# Patient Record
Sex: Male | Born: 1940 | Race: White | Hispanic: No | Marital: Married
Health system: Midwestern US, Academic
[De-identification: ages and names within clinical notes are randomized; demographics above are authoritative.]

---

## 2016-11-21 MED ORDER — APIXABAN 5 MG PO TAB
5 mg | ORAL_TABLET | Freq: Two times a day (BID) | ORAL | 11 refills | Status: DC
Start: 2016-11-21 — End: 2017-07-26

## 2016-12-12 MED ORDER — SODIUM CHLORIDE 0.9 % IV SOLP
INTRAVENOUS | 0 refills | Status: CN
Start: 2016-12-12 — End: ?

## 2016-12-17 MED ORDER — MAGNESIUM HYDROXIDE 2,400 MG/10 ML PO SUSP
10 mL | Freq: Once | ORAL | 0 refills | Status: CP
Start: 2016-12-17 — End: ?

## 2016-12-17 MED ORDER — APIXABAN 5 MG PO TAB
5 mg | Freq: Two times a day (BID) | ORAL | 0 refills | Status: DC
Start: 2016-12-17 — End: 2016-12-19

## 2016-12-17 MED ORDER — SODIUM CHLORIDE 0.9 % IV SOLP
INTRAVENOUS | 0 refills | Status: DC
Start: 2016-12-17 — End: 2016-12-18

## 2016-12-17 MED ORDER — POTASSIUM CHLORIDE 20 MEQ PO TBTQ
20-40 meq | ORAL | 0 refills | Status: AC | PRN
Start: 2016-12-17 — End: ?

## 2016-12-17 MED ORDER — DOFETILIDE 500 MCG PO CAP
500 ug | ORAL | 0 refills | Status: DC
Start: 2016-12-17 — End: 2016-12-19

## 2016-12-17 MED ORDER — ONDANSETRON HCL 4 MG PO TAB
4 mg | ORAL | 0 refills | Status: DC | PRN
Start: 2016-12-17 — End: 2016-12-19

## 2016-12-17 MED ORDER — ACETAMINOPHEN 325 MG PO TAB
650 mg | ORAL | 0 refills | Status: DC | PRN
Start: 2016-12-17 — End: 2016-12-19

## 2016-12-17 MED ORDER — MAGNESIUM SULFATE IN D5W 1 GRAM/100 ML IV PGBK
1 g | INTRAVENOUS | 0 refills | Status: AC | PRN
Start: 2016-12-17 — End: ?

## 2016-12-17 MED ORDER — POTASSIUM CHLORIDE 20 MEQ/15 ML PO LIQD
20-40 meq | ORAL | 0 refills | Status: AC | PRN
Start: 2016-12-17 — End: ?

## 2016-12-17 MED ORDER — LOSARTAN 50 MG PO TAB
50 mg | Freq: Every day | ORAL | 0 refills | Status: DC
Start: 2016-12-17 — End: 2016-12-19

## 2016-12-17 MED ORDER — DOCUSATE SODIUM 100 MG PO CAP
100 mg | Freq: Every day | ORAL | 0 refills | Status: DC | PRN
Start: 2016-12-17 — End: 2016-12-19

## 2016-12-17 MED ORDER — METOPROLOL SUCCINATE 25 MG PO TB24
25 mg | Freq: Every day | ORAL | 0 refills | Status: DC
Start: 2016-12-17 — End: 2016-12-18

## 2016-12-17 MED ORDER — SENNOSIDES 8.6 MG PO TAB
1 | Freq: Two times a day (BID) | ORAL | 0 refills | Status: DC
Start: 2016-12-17 — End: 2016-12-19

## 2016-12-17 MED ORDER — TAMSULOSIN 0.4 MG PO CP24
.4 mg | Freq: Every day | ORAL | 0 refills | Status: DC
Start: 2016-12-17 — End: 2016-12-18

## 2016-12-17 MED ORDER — TAMSULOSIN 0.4 MG PO CP24
.4 mg | Freq: Every day | ORAL | 0 refills | Status: DC
Start: 2016-12-17 — End: 2016-12-19

## 2016-12-17 MED ORDER — FINASTERIDE 5 MG PO TAB
5 mg | Freq: Every day | ORAL | 0 refills | Status: DC
Start: 2016-12-17 — End: 2016-12-19

## 2016-12-17 MED ORDER — ASPIRIN 81 MG PO TBEC
81 mg | Freq: Every day | ORAL | 0 refills | Status: DC
Start: 2016-12-17 — End: 2016-12-19

## 2016-12-18 MED ORDER — MAGNESIUM HYDROXIDE 2,400 MG/10 ML PO SUSP
10 mL | Freq: Once | ORAL | 0 refills | Status: CP
Start: 2016-12-18 — End: ?

## 2016-12-18 MED ORDER — AMLODIPINE 2.5 MG PO TAB
2.5 mg | Freq: Every day | ORAL | 0 refills | Status: DC
Start: 2016-12-18 — End: 2016-12-19

## 2016-12-18 MED ORDER — HYDRALAZINE 20 MG/ML IJ SOLN
10 mg | INTRAVENOUS | 0 refills | Status: DC | PRN
Start: 2016-12-18 — End: 2016-12-19

## 2016-12-18 MED ORDER — METOPROLOL SUCCINATE 50 MG PO TB24
50 mg | Freq: Every day | ORAL | 0 refills | Status: DC
Start: 2016-12-18 — End: 2016-12-19

## 2016-12-19 MED ORDER — AMLODIPINE 2.5 MG PO TAB
2.5 mg | ORAL_TABLET | Freq: Every day | ORAL | 3 refills | Status: DC
Start: 2016-12-19 — End: 2017-02-23

## 2016-12-19 MED ORDER — DOFETILIDE 500 MCG PO CAP
500 ug | ORAL_CAPSULE | Freq: Two times a day (BID) | ORAL | 1 refills | Status: DC
Start: 2016-12-19 — End: 2016-12-19

## 2016-12-19 MED ORDER — DOFETILIDE 500 MCG PO CAP
500 ug | ORAL_CAPSULE | Freq: Two times a day (BID) | ORAL | 1 refills | Status: DC
Start: 2016-12-19 — End: 2017-02-23

## 2016-12-19 MED ORDER — AMLODIPINE 2.5 MG PO TAB
2.5 mg | Freq: Once | ORAL | 0 refills | Status: DC
Start: 2016-12-19 — End: 2016-12-19

## 2016-12-19 MED ORDER — DOFETILIDE 500 MCG PO CAP
500 ug | ORAL_CAPSULE | Freq: Two times a day (BID) | ORAL | 0 refills | Status: DC
Start: 2016-12-19 — End: 2017-02-04

## 2016-12-19 MED ORDER — METOPROLOL SUCCINATE 50 MG PO TB24
50 mg | Freq: Every day | ORAL | 0 refills | Status: DC
Start: 2016-12-19 — End: 2016-12-19

## 2017-01-08 ENCOUNTER — Encounter: Admit: 2017-01-08 | Discharge: 2017-01-08 | Payer: MEDICARE

## 2017-01-08 NOTE — Telephone Encounter
-----   Message from Vickie Epley, RN sent at 01/08/2017 10:32 AM CDT -----  Regarding: FW: SDO LINQ pt with AF events      ----- Message -----  From: Stephenie Acres A  Sent: 01/08/2017  10:26 AM  To: Vickie Epley, RN  Subject: [facility] LINQ pt with AF events                       Presenting EGM on 01/08/2017 @ 00:04:50 shows SB in the 40's.     Reviewed Full Report shows:    #15 AF 6/7 @ 09:37 lasting 20 hrs 26 min with a median V rate at 150 bpm shows AF.  #14 AF 6/4 @ 10:07 lasting 23 hrs 24 min with a median V rate at 122 bpm shows SR > AF   #13 AF 5/20 @ 19:55 lasting 15 hrs 38 min with a median V rate at 113 bpm shows AF with sudden bursts of SR beats > AF  #11 AF 5/8 @ 12:01 lasting 6 hrs 32 min with a median V rate at 154 bpm shows AF with some AFL    Please see scanned data sheets for further review.  Results routed to Dr. Ricard Dillon for signature and review.

## 2017-01-08 NOTE — Telephone Encounter
Patient was aware of arrhythmias.  He is currently doing much better.  Routed to [facility] for review and recommendations.

## 2017-01-09 ENCOUNTER — Ambulatory Visit: Admit: 2017-01-09 | Discharge: 2017-01-10 | Payer: MEDICARE

## 2017-01-09 DIAGNOSIS — I480 Paroxysmal atrial fibrillation: Principal | ICD-10-CM

## 2017-01-25 ENCOUNTER — Encounter: Admit: 2017-01-25 | Discharge: 2017-01-25 | Payer: MEDICARE

## 2017-01-25 NOTE — Telephone Encounter
Dr Larina Bras advised that Stanley Campbell come in for appt with Dr Larina Bras to discuss potential LAAA ablation.  attempted to call,  LM asking them to c/b monday.  Dr Larina Bras advised he come in for an appt on 7/9 to discuss.   He has an appt on 7/10 with Dr Ricard Dillon in [place]  after they call back will determine appt status

## 2017-01-25 NOTE — Telephone Encounter
I called # and message rec'd. "the wireless customer you are calling is not taking messages".    I called the home # and was able to speak with his wife. She states he doesn't usually feel AF. I asked if he's reported any episodes of fatigue or chest pain or shortness of breath. She said that she asked him earlier today how he was feeling and he said that he was "little more tired than usual".  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     Patient is on tikosyn 500 mcg BID, toprol XL 25 mg/d and eliquis.   He has not been on any other AAD.    I reviewed my note with VN, she is going check if MPE available as he is in lab and scheduled to leave mid-day.

## 2017-01-25 NOTE — Telephone Encounter
-----   Message from Wilder Glade O'Laughlin sent at 01/25/2017 11:26 AM CDT -----  Regarding: MPE linq  Presenting EGM: 01/21/17 @ 0004 SB 40's    Summary report received and reviewed 2 new AF event  # 17- AF- 01/19/17 @ 1503 lasting 20 hours 4 minutes with median V rate of 107 bpm- AFL  # 16- AF- 01/09/17 @ 1151 lasting 18 hours 26 minutes with median V rate of 113 bpm- AFL    Please see scanned data sheets for further review, will continue to monitor.  Results routed to Dr. Larina Bras for signature and review.

## 2017-01-28 NOTE — Telephone Encounter
Louis Meckel, LPN  [facility] Nurse Ep            VM from wife [name] on triage line at 8:57am.   Michela Pitcher that they need to go volunteer and to call back on cell # [phone].      Spoke with [name] who tells me that patient is agreeable to OV with MPE on 02/04/17. Will have Jocelyn Lamer, RN CB wife to for time to w/in on 7/9.

## 2017-01-29 NOTE — Telephone Encounter
left message that there is an opening at 10:30 on 7/9 at the [place] office to discuss LAAA ablation.   asked them to c/b if further questions

## 2017-01-31 ENCOUNTER — Encounter: Admit: 2017-01-31 | Discharge: 2017-01-31 | Payer: MEDICARE

## 2017-01-31 DIAGNOSIS — Z959 Presence of cardiac and vascular implant and graft, unspecified: Principal | ICD-10-CM

## 2017-02-04 ENCOUNTER — Ambulatory Visit: Admit: 2017-02-04 | Discharge: 2017-02-05 | Payer: MEDICARE

## 2017-02-04 ENCOUNTER — Ambulatory Visit: Admit: 2017-02-04 | Discharge: 2017-02-04 | Payer: MEDICARE

## 2017-02-04 ENCOUNTER — Encounter: Admit: 2017-02-04 | Discharge: 2017-02-04 | Payer: MEDICARE

## 2017-02-04 DIAGNOSIS — I483 Typical atrial flutter: Principal | ICD-10-CM

## 2017-02-04 DIAGNOSIS — I4891 Unspecified atrial fibrillation: Principal | ICD-10-CM

## 2017-02-04 DIAGNOSIS — I10 Essential (primary) hypertension: ICD-10-CM

## 2017-02-04 DIAGNOSIS — I480 Paroxysmal atrial fibrillation: ICD-10-CM

## 2017-02-04 DIAGNOSIS — N400 Enlarged prostate without lower urinary tract symptoms: ICD-10-CM

## 2017-02-04 LAB — CBC AND DIFF
Lab: 15 g/dL (ref 14.00–18.00)
Lab: 44 % (ref 42.00–52.00)

## 2017-02-04 MED ORDER — LIDOCAINE (PF) 10 MG/ML (1 %) IJ SOLN
.1-2 mL | INTRAMUSCULAR | 0 refills | Status: CN | PRN
Start: 2017-02-04 — End: ?

## 2017-02-04 MED ORDER — SODIUM CHLORIDE 0.9 % IV SOLP
INTRAVENOUS | 0 refills | Status: CN
Start: 2017-02-04 — End: ?
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
## 2017-02-05 ENCOUNTER — Encounter: Admit: 2017-02-05 | Discharge: 2017-02-05 | Payer: MEDICARE

## 2017-02-05 ENCOUNTER — Ambulatory Visit: Admit: 2017-02-05 | Discharge: 2017-02-06 | Payer: MEDICARE

## 2017-02-05 DIAGNOSIS — I480 Paroxysmal atrial fibrillation: ICD-10-CM

## 2017-02-05 DIAGNOSIS — I483 Typical atrial flutter: Principal | ICD-10-CM

## 2017-02-05 DIAGNOSIS — I10 Essential (primary) hypertension: ICD-10-CM

## 2017-02-05 DIAGNOSIS — N400 Enlarged prostate without lower urinary tract symptoms: ICD-10-CM

## 2017-02-05 DIAGNOSIS — I4891 Unspecified atrial fibrillation: Principal | ICD-10-CM

## 2017-02-05 LAB — MAGNESIUM: Lab: 2.3 mg/dL — ABNORMAL HIGH (ref 1.5–2.5)

## 2017-02-05 LAB — BASIC METABOLIC PANEL: Lab: 99 mg/dL (ref 65–99)

## 2017-02-05 NOTE — Assessment & Plan Note
He has continued to have intermittent bouts of atrial arrhythmia despite the use of Tikosyn.  Today's QT interval is acceptable.  He is planning to proceed with an atrial flutter ablation later this month.

## 2017-02-05 NOTE — Assessment & Plan Note
He has labile BP--depends largely on his anxiety levels.

## 2017-02-05 NOTE — Assessment & Plan Note
He's planning to pursue flutter ablation later this month with Dr. Larina Bras.

## 2017-02-05 NOTE — Progress Notes
Date of Service: 02/05/2017    Stanley Campbell is a 76 y.o. male.       HPI     Stanley Campbell and his wife were in the [place] office today for a visit regarding his paroxysmal atrial arrhythmias.  He just saw Dr. Naoma Diener yesterday and there are plans for a flutter ablation later this month.    Stanley Campbell remains on oral anticoagulation and Tikosyn.  He has fairly profound weakness while in his atrial arrhythmias but he cannot specifically feel palpitation symptoms during the episodes.    He has had no bleeding complications related to oral anticoagulation nor has he had any TIA or stroke symptoms.    He denies any angina or exertional breathlessness.  Despite the difficulty with atrial arrhythmias he has been able to get a fair [place].         Vitals:    02/05/17 0954 02/05/17 1003   BP: 148/86 152/86   Pulse: 54    Weight: 90.3 kg (199 lb)    Height: 1.829 m (6')      Body mass index is 26.99 kg/m???.     Past Medical History  Patient Active Problem List    Diagnosis Date Noted   ??? Typical atrial flutter (HCC) 02/04/2017     Added automatically from request for surgery [id number]     ??? 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 Paroxysmal atrial fibrillation (HCC) 08/18/2015     ~2005 - Isolated episode of PAF  07/2015 - AF episode while in [facility] ED for hypertension  09/11/16 - Rapid AF during treadmill MPI at [facility] study     ??? Hypertension 08/18/2015   ??? BPH (benign prostatic hyperplasia) 08/18/2015   ??? Anxiety 08/18/2015         Review of Systems   Constitution: Negative.   HENT: Positive for hearing loss.    Eyes: Negative.    Cardiovascular: Positive for irregular heartbeat and palpitations.   Respiratory: Negative.    Endocrine: Negative.    Hematologic/Lymphatic: Bruises/bleeds easily.   Skin: Positive for skin cancer.   Musculoskeletal: Negative.    Gastrointestinal: Negative.    Genitourinary: Positive for decreased libido.   Neurological: Negative. Psychiatric/Behavioral: Negative.    Allergic/Immunologic: Negative.        Physical Exam    Physical Exam   General Appearance: no distress   Skin: warm, no ulcers or xanthomas   Digits and Nails: no cyanosis or clubbing   Eyes: conjunctivae and lids normal, pupils are equal and round   Teeth/Gums/Palate: dentition unremarkable, no lesions   Lips & Oral Mucosa: no pallor or cyanosis   Neck Veins: normal JVP , neck veins are not distended   Thyroid: no nodules, masses, tenderness or enlargement   Chest Inspection: chest is normal in appearance   Respiratory Effort: breathing comfortably, no respiratory distress   Auscultation/Percussion: lungs clear to auscultation, no rales or rhonchi, no wheezing   PMI: PMI not enlarged or displaced   Cardiac Rhythm: regular rhythm and normal rate   Cardiac Auscultation: S1, S2 normal, no rub, no gallop   Murmurs: no murmur   Peripheral Circulation: normal peripheral circulation   Carotid Arteries: normal carotid upstroke bilaterally, no bruits   Radial Arteries: normal symmetric radial pulses   Abdominal Aorta: no abdominal aortic bruit   Pedal Pulses: normal symmetric pedal pulses   Lower Extremity Edema: no lower extremity edema   Abdominal Exam:  soft, non-tender, no masses, bowel sounds normal   Liver & Spleen: no organomegaly   Gait & Station: walks without assistance   Muscle Strength: normal muscle tone   Orientation: oriented to time, place and person   Affect & Mood: appropriate and sustained affect   Language and Memory: patient responsive and seems to comprehend information   Neurologic Exam: neurological assessment grossly intact   Other: moves all extremities      Cardiovascular Studies    EKG:  Sinus rhythm, rate 54.  Left anterior fascicular block.  QTc 425 msec.    Problems Addressed Today  Encounter Diagnoses   Name Primary?   ??? Typical atrial flutter (HCC)    ??? Essential hypertension    ??? Paroxysmal atrial fibrillation (HCC)        Assessment and Plan Typical atrial flutter (HCC)  He's planning to pursue flutter ablation later this month with Dr. Naoma Diener.      Hypertension  He has labile BP--depends largely on his anxiety levels.    Paroxysmal atrial fibrillation (HCC)  He has continued to have intermittent bouts of atrial arrhythmia despite the use of Tikosyn.  Today's QT interval is acceptable.  He is planning to proceed with an atrial flutter ablation later this month.      Current Medications (including today's revisions)  ??? ALPRAZolam (XANAX) 0.25 mg tablet Take 0.25 mg by mouth at bedtime as needed for Anxiety.   ??? amLODIPine (NORVASC) 2.5 mg tablet Take 1 tablet by mouth daily.   ??? apixaban (ELIQUIS) 5 mg tablet Take 1 tablet by mouth twice daily.   ??? aspirin EC 81 mg tablet Take 1 Tab by mouth daily. Take with food.   ??? dofetilide (TIKOSYN) 500 mcg capsule Take 1 capsule by mouth twice daily.   ??? finasteride (PROSCAR) 5 mg tablet Take 5 mg by mouth daily.   ??? losartan(+) (COZAAR) 100 mg tablet Take 50 mg by mouth daily.   ??? metoprolol XL (TOPROL XL) 25 mg extended release tablet Take 25 mg by mouth daily.   ??? midodrine (PROAMATINE) 5 mg tablet Take 1 Tab by mouth every 4 hours as needed (low blood pressure).   ??? tamsulosin (FLOMAX) 0.4 mg capsule Take 0.4 mg by mouth daily. Do not crush, chew or open capsules. Take 30 minutes following the same meal each day.

## 2017-02-08 ENCOUNTER — Ambulatory Visit: Admit: 2017-02-08 | Discharge: 2017-02-09 | Payer: MEDICARE

## 2017-02-08 DIAGNOSIS — I480 Paroxysmal atrial fibrillation: Principal | ICD-10-CM

## 2017-02-13 ENCOUNTER — Encounter: Admit: 2017-02-13 | Discharge: 2017-02-13 | Payer: MEDICARE

## 2017-02-13 MED ORDER — DOCUSATE SODIUM 100 MG PO CAP
100 mg | Freq: Every day | ORAL | 0 refills | Status: CN | PRN
Start: 2017-02-13 — End: ?

## 2017-02-13 MED ORDER — NITROGLYCERIN 0.4 MG SL SUBL
.4 mg | SUBLINGUAL | 0 refills | Status: CN | PRN
Start: 2017-02-13 — End: ?

## 2017-02-13 MED ORDER — ACETAMINOPHEN 325 MG PO TAB
650 mg | ORAL | 0 refills | Status: CN | PRN
Start: 2017-02-13 — End: ?

## 2017-02-13 MED ORDER — ALUMINUM-MAGNESIUM HYDROXIDE 200-200 MG/5 ML PO SUSP
30 mL | ORAL | 0 refills | Status: CN | PRN
Start: 2017-02-13 — End: ?

## 2017-02-13 NOTE — H&P (View-Only)
The original H and P below was performed on 02/05/17 by Dr. Barry Dienes.    Ceola Para A. [email or web address] PA-C (pgr [phone])     Vanice Sarah, MD   Cardiology   Typical atrial flutter [facility]) +2 more   Dx   Paroxysmal Afib, Hypertension; Referred by Steva Ready, MD   Reason for Visit    Progress Notes   Vanice Sarah, MD (Physician) ??? ??? Cardiology ??? ??? 02/05/17 1000 ??? ??? Signed      Date of Service: 02/05/2017  ???  ZAEL SHUMAN is a 76 y.o. male.     ???  HPI  Stanley Campbell and his wife were in the [place] office today for a visit regarding his paroxysmal atrial arrhythmias.  He just saw Dr. Naoma Diener yesterday and there are plans for a flutter ablation later this month.  ???  Stanley Campbell remains on oral anticoagulation and Tikosyn.  He has fairly profound weakness while in his atrial arrhythmias but he cannot specifically feel palpitation symptoms during the episodes.  ???  He has had no bleeding complications related to oral anticoagulation nor has he had any TIA or stroke symptoms.  ???  He denies any angina or exertional breathlessness.  Despite the difficulty with atrial arrhythmias he has been able to [place] a [place].  ???  ???       Vitals:   ??? 02/05/17 0954 02/05/17 1003   BP: 148/86 152/86   Pulse: 54 ???   Weight: 90.3 kg (199 lb) ???   Height: 1.829 m (6') ???   ???  Body mass index is 26.99 kg/m???.   ???  Past Medical History        Patient Active Problem List   ??? Diagnosis Date Noted   ??? Typical atrial flutter (HCC) 02/04/2017   ??? ??? Added automatically from request for surgery [id number]  ???   ??? 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 Paroxysmal atrial fibrillation (HCC) 08/18/2015   ??? ??? ~2005 - Isolated episode of PAF  07/2015 - AF episode while in [facility] ED for hypertension  09/11/16 - Rapid AF during treadmill MPI at [facility] study  ???   ??? Hypertension 08/18/2015   ??? BPH (benign prostatic hyperplasia) 08/18/2015   ??? Anxiety 08/18/2015   ???  ???  ???  Review of Systems   Constitution: Negative. HENT: Positive for hearing loss.    Eyes: Negative.    Cardiovascular: Positive for irregular heartbeat and palpitations.   Respiratory: Negative.    Endocrine: Negative.    Hematologic/Lymphatic: Bruises/bleeds easily.   Skin: Positive for skin cancer.   Musculoskeletal: Negative.    Gastrointestinal: Negative.    Genitourinary: Positive for decreased libido.   Neurological: Negative.    Psychiatric/Behavioral: Negative.    Allergic/Immunologic: Negative.    ???  ???  Physical Exam  ???  Physical Exam   General Appearance: no distress   Skin: warm, no ulcers or xanthomas   Digits and Nails: no cyanosis or clubbing   Eyes: conjunctivae and lids normal, pupils are equal and round   Teeth/Gums/Palate: dentition unremarkable, no lesions   Lips & Oral Mucosa: no pallor or cyanosis   Neck Veins: normal JVP , neck veins are not distended   Thyroid: no nodules, masses, tenderness or enlargement   Chest Inspection: chest is normal in appearance   Respiratory Effort: breathing comfortably, no respiratory distress   Auscultation/Percussion: lungs clear to auscultation, no  rales or rhonchi, no wheezing   PMI: PMI not enlarged or displaced   Cardiac Rhythm: regular rhythm and normal rate   Cardiac Auscultation: S1, S2 normal, no rub, no gallop   Murmurs: no murmur   Peripheral Circulation: normal peripheral circulation   Carotid Arteries: normal carotid upstroke bilaterally, no bruits   Radial Arteries: normal symmetric radial pulses   Abdominal Aorta: no abdominal aortic bruit   Pedal Pulses: normal symmetric pedal pulses   Lower Extremity Edema: no lower extremity edema   Abdominal Exam: soft, non-tender, no masses, bowel sounds normal   Liver & Spleen: no organomegaly   Gait & Station: walks without assistance   Muscle Strength: normal muscle tone   Orientation: oriented to time, place and person   Affect & Mood: appropriate and sustained affect   Language and Memory: patient responsive and seems to comprehend information Neurologic Exam: neurological assessment grossly intact   Other: moves all extremities  ???  ???  Social History   Substance Use Topics   ??? Smoking status: Never Smoker   ??? Smokeless tobacco: Never Used   ??? Alcohol use No       No past surgical history on file.    No Known Allergies    Family History   Problem Relation Age of Onset   ??? Hypertension Mother    ??? Depression Mother    ??? Hypertension Father    ??? Cancer Father          Cardiovascular Studies  ???  EKG:  Sinus rhythm, rate 54.  Left anterior fascicular block.  QTc 425 msec.  ???  Problems Addressed Today       Encounter Diagnoses   Name Primary?   ??? Typical atrial flutter (HCC) ???   ??? Essential hypertension ???   ??? Paroxysmal atrial fibrillation (HCC) ???   ???  ???  Assessment and Plan  ???  Typical atrial flutter (HCC)  He's planning to pursue flutter ablation later this month with Dr. Naoma Diener.    ???  Hypertension  He has labile BP--depends largely on his anxiety levels.  ???  Paroxysmal atrial fibrillation (HCC)  He has continued to have intermittent bouts of atrial arrhythmia despite the use of Tikosyn.  Today's QT interval is acceptable.  He is planning to proceed with an atrial flutter ablation later this month.  ???  ???  Current Medications (including today's revisions)  ??? ALPRAZolam (XANAX) 0.25 mg tablet Take 0.25 mg by mouth at bedtime as needed for Anxiety.   ??? amLODIPine (NORVASC) 2.5 mg tablet Take 1 tablet by mouth daily.   ??? apixaban (ELIQUIS) 5 mg tablet Take 1 tablet by mouth twice daily.   ??? aspirin EC 81 mg tablet Take 1 Tab by mouth daily. Take with food.   ??? dofetilide (TIKOSYN) 500 mcg capsule Take 1 capsule by mouth twice daily.   ??? finasteride (PROSCAR) 5 mg tablet Take 5 mg by mouth daily.   ??? losartan(+) (COZAAR) 100 mg tablet Take 50 mg by mouth daily.   ??? metoprolol XL (TOPROL XL) 25 mg extended release tablet Take 25 mg by mouth daily.   ??? midodrine (PROAMATINE) 5 mg tablet Take 1 Tab by mouth every 4 hours as needed (low blood pressure). ??? tamsulosin (FLOMAX) 0.4 mg capsule Take 0.4 mg by mouth daily. Do not crush, chew or open capsules. Take 30 minutes following the same meal each day.   ???  ???

## 2017-02-16 ENCOUNTER — Encounter: Admit: 2017-02-16 | Discharge: 2017-02-16 | Payer: MEDICARE

## 2017-02-18 ENCOUNTER — Encounter: Admit: 2017-02-18 | Discharge: 2017-02-18 | Payer: MEDICARE

## 2017-02-18 DIAGNOSIS — N400 Enlarged prostate without lower urinary tract symptoms: ICD-10-CM

## 2017-02-18 DIAGNOSIS — I481 Persistent atrial fibrillation: Secondary | ICD-10-CM

## 2017-02-18 DIAGNOSIS — I483 Typical atrial flutter: ICD-10-CM

## 2017-02-18 DIAGNOSIS — I10 Essential (primary) hypertension: ICD-10-CM

## 2017-02-18 DIAGNOSIS — I4891 Unspecified atrial fibrillation: Principal | ICD-10-CM

## 2017-02-18 LAB — BASIC METABOLIC PANEL
Lab: 1.2 mg/dL — ABNORMAL HIGH (ref <5.7)
Lab: 10 mg/dL — ABNORMAL HIGH (ref >=60)
Lab: 106 MMOL/L (ref 98–110)
Lab: 139 MMOL/L (ref 137–147)
Lab: 23 mg/dL (ref <150)
Lab: 25 MMOL/L (ref 21–30)
Lab: 4.2 MMOL/L (ref 3.5–5.1)
Lab: 56 mL/min — ABNORMAL LOW (ref >60)
Lab: 8 mg/dL (ref <200)
Lab: 99 mg/dL — ABNORMAL LOW (ref >40)
Lab: >60 mL/min (ref >60)

## 2017-02-18 LAB — MAGNESIUM: Lab: 2.4 mg/dL (ref 1.6–2.6)

## 2017-02-18 MED ORDER — PROPOFOL INJ 10 MG/ML IV VIAL
0 refills | Status: DC
Start: 2017-02-18 — End: 2017-02-18

## 2017-02-18 MED ORDER — SODIUM CHLORIDE 0.9 % IV SOLP
INTRAVENOUS | 0 refills | Status: AC
Start: 2017-02-18 — End: ?

## 2017-02-18 MED ORDER — APIXABAN 5 MG PO TAB
5 mg | Freq: Two times a day (BID) | ORAL | 0 refills | Status: DC
Start: 2017-02-18 — End: 2017-02-23
  Administered 2017-02-19 – 2017-02-23 (×10): 5 mg via ORAL

## 2017-02-18 MED ORDER — MAGNESIUM HYDROXIDE 2,400 MG/10 ML PO SUSP
10 mL | Freq: Every evening | ORAL | 0 refills | Status: DC | PRN
Start: 2017-02-18 — End: 2017-02-23
  Administered 2017-02-19 – 2017-02-21 (×3): 10 mL via ORAL

## 2017-02-18 MED ORDER — MIDODRINE 5 MG PO TAB
5 mg | ORAL | 0 refills | Status: DC | PRN
Start: 2017-02-18 — End: 2017-02-21

## 2017-02-18 MED ORDER — FINASTERIDE 5 MG PO TAB
5 mg | Freq: Every day | ORAL | 0 refills | Status: DC
Start: 2017-02-18 — End: 2017-02-23
  Administered 2017-02-19 – 2017-02-23 (×5): 5 mg via ORAL

## 2017-02-18 MED ORDER — AMLODIPINE 5 MG PO TAB
2.5 mg | Freq: Every day | ORAL | 0 refills | Status: DC
Start: 2017-02-18 — End: 2017-02-21
  Administered 2017-02-19 – 2017-02-20 (×2): 2.5 mg via ORAL

## 2017-02-18 MED ORDER — TAMSULOSIN 0.4 MG PO CAP
.4 mg | Freq: Every day | ORAL | 0 refills | Status: DC
Start: 2017-02-18 — End: 2017-02-23
  Administered 2017-02-19 – 2017-02-23 (×6): 0.4 mg via ORAL

## 2017-02-18 MED ORDER — METOPROLOL SUCCINATE 25 MG PO TB24
25 mg | Freq: Every day | ORAL | 0 refills | Status: DC
Start: 2017-02-18 — End: 2017-02-23
  Administered 2017-02-19 – 2017-02-22 (×4): 25 mg via ORAL

## 2017-02-18 MED ORDER — CEFAZOLIN INJ 1GM IVP
1 g | INTRAVENOUS | 0 refills | Status: CP
Start: 2017-02-18 — End: ?
  Administered 2017-02-19: 1 g via INTRAVENOUS

## 2017-02-18 MED ORDER — LIDOCAINE HCL 2 % MM JELP
Freq: Once | TOPICAL | 0 refills | Status: AC
Start: 2017-02-18 — End: ?

## 2017-02-18 MED ORDER — ACETAMINOPHEN 325 MG PO TAB
325-650 mg | ORAL | 0 refills | Status: DC | PRN
Start: 2017-02-18 — End: 2017-02-23
  Administered 2017-02-20: 10:00:00 650 mg via ORAL
  Administered 2017-02-20: 19:00:00 325 mg via ORAL
  Administered 2017-02-21: 02:00:00 650 mg via ORAL

## 2017-02-18 MED ORDER — LIDOCAINE (PF) 10 MG/ML (1 %) IJ SOLN
.1-2 mL | INTRAMUSCULAR | 0 refills | Status: DC | PRN
Start: 2017-02-18 — End: 2017-02-19

## 2017-02-18 MED ORDER — NITROGLYCERIN 0.4 MG SL SUBL
.4 mg | SUBLINGUAL | 0 refills | Status: DC | PRN
Start: 2017-02-18 — End: 2017-02-23

## 2017-02-18 MED ORDER — ALUMINUM-MAGNESIUM HYDROXIDE 200-200 MG/5 ML PO SUSP
30 mL | ORAL | 0 refills | Status: DC | PRN
Start: 2017-02-18 — End: 2017-02-23

## 2017-02-18 MED ORDER — ACETAMINOPHEN 325 MG PO TAB
650 mg | ORAL | 0 refills | Status: DC | PRN
Start: 2017-02-18 — End: 2017-02-18

## 2017-02-18 MED ORDER — ALPRAZOLAM 0.25 MG PO TAB
.25 mg | Freq: Every evening | ORAL | 0 refills | Status: DC | PRN
Start: 2017-02-18 — End: 2017-02-18

## 2017-02-18 MED ORDER — DOFETILIDE 500 MCG PO CAP
500 ug | Freq: Two times a day (BID) | ORAL | 0 refills | Status: DC
Start: 2017-02-18 — End: 2017-02-19

## 2017-02-18 MED ORDER — MIDODRINE 5 MG PO TAB
5 mg | ORAL | 0 refills | Status: DC | PRN
Start: 2017-02-18 — End: 2017-02-18

## 2017-02-18 MED ORDER — SODIUM CHLORIDE 0.9 % IV SOLP
INTRAVENOUS | 0 refills | Status: DC
Start: 2017-02-18 — End: 2017-02-18
  Administered 2017-02-18: 15:00:00 1000.000 mL via INTRAVENOUS

## 2017-02-18 MED ORDER — LIDOCAINE (PF) 10 MG/ML (1 %) IJ SOLN
.1-2 mL | INTRAMUSCULAR | 0 refills | Status: DC | PRN
Start: 2017-02-18 — End: 2017-02-18

## 2017-02-18 MED ORDER — CEFAZOLIN INJ 1GM IVP
2 g | Freq: Once | INTRAVENOUS | 0 refills | Status: CP
Start: 2017-02-18 — End: ?
  Administered 2017-02-19: 16:00:00 2 g via INTRAVENOUS

## 2017-02-18 MED ORDER — CEFAZOLIN INJ 1GM IVP
1 g | INTRAVENOUS | 0 refills | Status: DC
Start: 2017-02-18 — End: 2017-02-19

## 2017-02-18 MED ORDER — LIDOCAINE HCL 2 % MM JELP
Freq: Once | TOPICAL | 0 refills | Status: DC
Start: 2017-02-18 — End: 2017-02-19

## 2017-02-18 MED ORDER — CEFAZOLIN INJ 1GM IVP
2 g | Freq: Once | INTRAVENOUS | 0 refills | Status: DC
Start: 2017-02-18 — End: 2017-02-19

## 2017-02-18 MED ORDER — ASPIRIN 81 MG PO TBEC
81 mg | Freq: Every day | ORAL | 0 refills | Status: DC
Start: 2017-02-18 — End: 2017-02-23
  Administered 2017-02-19 – 2017-02-23 (×5): 81 mg via ORAL

## 2017-02-18 MED ORDER — DOCUSATE SODIUM 100 MG PO CAP
100 mg | Freq: Every day | ORAL | 0 refills | Status: DC | PRN
Start: 2017-02-18 — End: 2017-02-23
  Administered 2017-02-19 – 2017-02-23 (×5): 100 mg via ORAL

## 2017-02-18 MED ORDER — LIDOCAINE (PF) 10 MG/ML (1 %) IJ SOLN
.1-2 mL | INTRAMUSCULAR | 0 refills | Status: DC | PRN
Start: 2017-02-18 — End: 2017-02-21

## 2017-02-18 MED ORDER — LOSARTAN 50 MG PO TAB
50 mg | Freq: Every day | ORAL | 0 refills | Status: DC
Start: 2017-02-18 — End: 2017-02-21
  Administered 2017-02-19 – 2017-02-20 (×2): 50 mg via ORAL

## 2017-02-18 MED ORDER — ALPRAZOLAM 0.25 MG PO TAB
.25 mg | ORAL | 0 refills | Status: DC | PRN
Start: 2017-02-18 — End: 2017-02-23
  Administered 2017-02-18 – 2017-02-21 (×3): 0.25 mg via ORAL

## 2017-02-18 MED ORDER — HYDROCODONE-ACETAMINOPHEN 5-325 MG PO TAB
1 | ORAL | 0 refills | Status: DC | PRN
Start: 2017-02-18 — End: 2017-02-23

## 2017-02-18 MED ORDER — ONDANSETRON HCL (PF) 4 MG/2 ML IJ SOLN
4 mg | INTRAVENOUS | 0 refills | Status: DC | PRN
Start: 2017-02-18 — End: 2017-02-23

## 2017-02-18 NOTE — Progress Notes
Patient arrived on unit via ambulation accompanied by family. Patient transferred to the bed without assistance. Frailty score equals 2  Assessment completed, refer to flowsheet for details. Orders released, reviewed, and implemented as appropriate. Oriented to surroundings, call light within reach. Plan of care reviewed.  Will continue to monitor and assess.

## 2017-02-18 NOTE — Anesthesia Post-Procedure Evaluation
Post-Anesthesia Evaluation    Name: Stanley Campbell      MRN: [id number]     DOB: 01/28/1941     Age: 76 y.o.     Sex: male   __________________________________________________________________________     Procedure Date: 02/18/2017  Procedure: Procedure(s):  INTRACARDIAC CATHETER ABLATION WITH COMPREHENSIVE ELECTROPHYSIOLOGIC EVALUATION - TYPICAL FLUTTER      Surgeon: Surgeon(s):  Emert, Vilma Prader, MD    Post-Anesthesia Vitals         Post Anesthesia Evaluation Note    Evaluation location: other (EP lab)  Patient participation: recovered; patient unable to participate at baseline (Recovered to baseline of EP sedation)  Level of consciousness: sleepy but conscious  Pain management: adequate    Hydration: normovolemia  Temperature: 36.0C - 38.4C  Airway patency: adequate    Perioperative Events  Perioperative events:  no       Post-op nausea and vomiting: no PONV    Postoperative Status  Cardiovascular status: hemodynamically stable  Respiratory status: spontaneous ventilation and supplemental oxygen      Perioperative Events  Perioperative Event: No  Emergency Case Activation: No

## 2017-02-18 NOTE — Progress Notes
Cardiac Electrophysiology Brief Post-Procedure Note    Attending Operator: Holland Commons, MD    Procedure: Attempted but aborted right-sided CTI AFL RFA  Elective external DC synchronous cardioversion    Complications:  None    Preop Diagnosis: Paroxysmal Atrial Fibrillation and atrial flutter  Post op Diagnosis:  Same    Findings: Catheters positioned and has started RF ablation patient went into atrial flutter.  The flutter was organized on the right side intermittently but with changing activation and clearly AFIB with fractionation on the left side and started with a PAC from the LA based on the CS activation being earliest compared to the crista or lateral RA wall.  Also the activation pattern suggested potential passive activation of the RA.  We attempted cardioversion on 3 separate occasions at 300 J followed by 360 J followed by another 360 J.  We did not restore any sinus rhythm.  We then moved the patches to an anterior posterior location.  We again attempted to 360 J cardioversions.  The second 1 with manual pressure on the anterior patch resulted in conversion to sinus bradycardia for 3-4 beats followed by early recurrent AFIB.  His ventricular rates are in the 110-120 bpm range.    At that point unable to restore sinus rhythm and unable to assess for any ablation after doing some RF lines with questionable contact along the isthmus and a markedly rotated RA we ended the procedure.    Specimens removed:  None  Estimated blood loss: 10 cc    Sheaths: In half [place] SRO long sheath placed via the right femoral vein used for mapping and ablation; 7 [place] sheath via the right femoral vein for the Cristae 20 pulled deflectable catheter along the lateral wall of the RA and 6 French sheath in the right femoral vein for the CS decapolar deflectable catheter.    Recommendations:   Please refer to post-procedure note for full recommendations  Pain control as needed Plan for DDDR permanent pacemaker implantation with cardioversion at that time tomorrow.  Initiation of Sotalol once pacemaker implanted  Discontinuation of Tikosyn

## 2017-02-19 ENCOUNTER — Encounter: Admit: 2017-02-19 | Discharge: 2017-02-19 | Payer: MEDICARE

## 2017-02-19 DIAGNOSIS — I481 Persistent atrial fibrillation: Secondary | ICD-10-CM

## 2017-02-19 LAB — BASIC METABOLIC PANEL: Lab: 138 MMOL/L — ABNORMAL LOW (ref >60)

## 2017-02-19 LAB — CBC: Lab: 7.1 K/UL (ref 4.5–11.0)

## 2017-02-19 LAB — MAGNESIUM: Lab: 2.2 mg/dL — ABNORMAL LOW (ref 1.6–2.6)

## 2017-02-19 MED ORDER — SOTALOL 120 MG PO TAB
120 mg | ORAL | 0 refills | Status: DC
Start: 2017-02-19 — End: 2017-02-22
  Administered 2017-02-20 – 2017-02-22 (×7): 120 mg via ORAL

## 2017-02-19 MED ORDER — POTASSIUM CHLORIDE 20 MEQ PO TBTQ
20-40 meq | ORAL | 0 refills | Status: AC | PRN
Start: 2017-02-19 — End: ?

## 2017-02-19 MED ORDER — MAGNESIUM SULFATE IN D5W 1 GRAM/100 ML IV PGBK
1 g | INTRAVENOUS | 0 refills | Status: AC | PRN
Start: 2017-02-19 — End: ?

## 2017-02-19 MED ORDER — PROPOFOL INJ 10 MG/ML IV VIAL
0 refills | Status: DC
Start: 2017-02-19 — End: 2017-02-19

## 2017-02-19 MED ORDER — PERFLUTREN LIPID MICROSPHERES 1.1 MG/ML IV SUSP
1-20 mL | Freq: Once | INTRAVENOUS | 0 refills | Status: CP
Start: 2017-02-19 — End: ?
  Administered 2017-02-19: 19:00:00 2 mL via INTRAVENOUS

## 2017-02-19 MED ORDER — POTASSIUM CHLORIDE 20 MEQ/15 ML PO LIQD
20-40 meq | ORAL | 0 refills | Status: AC | PRN
Start: 2017-02-19 — End: ?

## 2017-02-19 NOTE — Anesthesia Pre-Procedure Evaluation
Anesthesia Pre-Procedure Evaluation    Name: Stanley Campbell      MRN: [id number]     DOB: 1940/10/17     Age: 76 y.o.     Sex: male   __________________________________________________________________________     Procedure Date: 02/19/2017   Procedure: Procedure(s):  INSERTION/ REPLACEMENT PERMANENT PACEMAKER WITH ATRIAL AND VENTRICULAR LEAD  External Cardioversion  Removal Cardiac Event Recorder     Physical Assessment  Vital Signs (last filed in past 24 hours):  BP: 138/87 (07/24 0849)  Temp: 36 ???C (96.8 ???F) (07/24 0849)  Pulse: 124 (07/24 0849)  Respirations: 16 PER MINUTE (07/24 0400)  SpO2: 99 % (07/24 0610)  O2 Delivery: None (Room Air) (07/24 0610)  Height: 182.9 cm (72) (07/23 0959)  Weight: 90.5 kg (199 lb 8.3 oz) (07/23 0959)      Patient History  No Known Allergies     Current Medications    Medication Directions   ALPRAZolam (XANAX) 0.25 mg tablet Take 0.25 mg by mouth at bedtime as needed for Anxiety.   amLODIPine (NORVASC) 2.5 mg tablet Take 1 tablet by mouth daily.   apixaban (ELIQUIS) 5 mg tablet Take 1 tablet by mouth twice daily.   aspirin EC 81 mg tablet Take 1 Tab by mouth daily. Take with food.   dofetilide (TIKOSYN) 500 mcg capsule Take 1 capsule by mouth twice daily.   finasteride (PROSCAR) 5 mg tablet Take 5 mg by mouth daily.   losartan(+) (COZAAR) 100 mg tablet Take 50 mg by mouth daily.   metoprolol XL (TOPROL XL) 25 mg extended release tablet Take 25 mg by mouth daily.   midodrine (PROAMATINE) 5 mg tablet Take 1 Tab by mouth every 4 hours as needed (low blood pressure).   tamsulosin (FLOMAX) 0.4 mg capsule Take 0.4 mg by mouth daily. Do not crush, chew or open capsules. Take 30 minutes following the same meal each day.         Review of Systems/Medical History      Patient summary reviewed  Nursing notes reviewed  Pertinent labs reviewed    PONV Screening: Non-smoker  No history of anesthetic complications  No family history of anesthetic complications      Airway - negative Pulmonary - negative          Cardiovascular         Exercise tolerance: >4 METS       Beta Blocker therapy: Yes      Beta blockers within 24 hours: Yes        Hypertension, well controlled      Dysrhythmias (A flutter.  History of A fib resolved with meds per patient)      GI/Hepatic/Renal - negative        Neuro/Psych         Sensory deficit (Patient wears hearing aids)        Psychiatric history          Anxiety (and claustrophobia - patient asks not to be restrained until after sedation)      Musculoskeletal - negative        Endocrine/Other         Malignancy (History of multiple skin cancers removed)      Enlarged prostate   Physical Exam    Airway Findings      Mallampati: II      TM distance: >3 FB      Neck ROM: full      Mouth opening: good  Airway patency: adequate    Cardiovascular Findings:       Rhythm: regular      Rate: normal      Comments: Atrial Flutter      Pulmonary Findings: Negative      Breath sounds clear to auscultation.    Abdominal Findings: Negative      Neurological Findings: Negative         Diagnostic Tests  Hematology:   Lab Results   Component Value Date    HGB 14.5 02/19/2017    HCT 43.7 02/19/2017    PLTCT 195 02/19/2017    WBC 7.1 02/19/2017    NEUT 75.8 02/04/2017    ANC 5.1 02/04/2017    ALC 1.0 02/04/2017    MCV 91.2 02/19/2017    MCH 30.3 02/19/2017    MCHC 33.3 02/19/2017    MPV 8.3 02/19/2017    RDW 12.9 02/19/2017         General Chemistry:   Lab Results   Component Value Date    NA 138 02/19/2017    K 4.2 02/19/2017    CL 106 02/19/2017    CO2 27 02/19/2017    GAP 5 02/19/2017    BUN 24 02/19/2017    CR 1.22 02/19/2017    GLU 107 02/19/2017    GLU 99 02/04/2017    CA 9.6 02/19/2017    ALBUMIN 4.0 02/08/2016    MG 2.2 02/19/2017    TOTBILI 1.10 02/08/2016      Coagulation:   Lab Results   Component Value Date    INR 1.2 12/18/2016         Anesthesia Plan    ASA score: 3   Plan: MAC  Induction method: intravenous  NPO status: acceptable Comments: (Multiple DCCV attempted yesterday without success. Tolerated well. )      Informed Consent  Anesthetic plan and risks discussed with patient.  Use of blood products discussed with patient; consented to blood products.      Plan discussed with: CRNA and anesthesiologist.

## 2017-02-19 NOTE — Progress Notes
Pt appears to be out of AFib. EKG obtained. Pt Atrial Paced. Merlyn Albert, APRN at bedside.

## 2017-02-19 NOTE — Consults
Admission Date: 02/18/2017  Date of Consultation:  02/19/2017  LOS: 0 days  Requesting Physician: Delbert Harness, MD  Consulting Physician:  Leanne Lovely, MD   Code Status: Prior    Reason for Consultation  Opinion and recommendations regarding EP physician, sotalol start.    Assessment/Plan:  Paroxysmal atrial fibrillation/atrial flutter  -ECG noted in Afib with PVCs HR 90 this am  -02/18/17 Attempted but aborted right-sided CTI AFL RFA, Afib noted, attempted multiple elective external DC synchronous cardioversion's, he converted to sinus bradycardia for 3-4 beats followed by early recurrent Afib.  His ventricular rates were in the 110-120 bpm range. At that point unable to restore sinus rhythm and unable to assess for any ablation after doing some RF lines with questionable contact along the isthmus and a markedly rotated RA we ended the procedure.  -Tikosyn discontinued, patient reported his last dose of Tikosyn was on 02/18/17 @ 0700.    Tachy brady syndrome  -Noted in the EP lab on 02/18/17, as noted above.  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     Hypertension  -stable, SBP 90s-180s, SBP since MN 90s-130s    Anxiety  -PTA Xanax PRN for anxiety    Plan:  -Patient stable s/p PPM implant today  -Admitted to Medicine service for Sotalol initiation.  -We are consulted for Sotalol and QTc management.   -Continue Eliquis for anticoagulation.   -Continue Toprol XL.  -Tikosyn washout is 36 hours. Patient reports he took last dose of Tikosyn on 02/18/17 @ 0700.  -OK to start Sotalol 120 mg PO every 10 hours @ 1900 this evening.   -Baseline QTc 407 msec, K+ 4.2, Mg 2.2, Cr 1.22, Crcl 68.136  -Will monitor QTc post 2 hours every Sotalol dose x 5 total doses.  -Daily BMP, Mg  -recommend keeping K+ 4.O or >, Mg 2.0 or >  -patient currently in sinus rhythm, atrial paced complexes.  -NPO after MN  -If patient converts back to Afib, will schedule DCCV tomorrow am. -CXR in am to assess for appropriate lead placement and no evidence of pneumothorax.   -Device check in am to assess PPM function, pacing, sensing, and threshold testing.  Addendum:  -Please call cardiology fellow after hours if QTc is not within protocol for next Sotalol dose.     Patient seen and plan discussed with Dr. Clint Bolder.     Lysle Morales, APRN-C    History of Present Illness  Stanley Campbell is a 76 y.o. male patient with history of paroxysmal atrial fib, typical atrial flutter, HTN, anxiety, and BPH. He was seen in the clinic for paroxysmal atrial arrhythmias and he decided on having an atrial flutter ablation. He presented yesterday for the atrial flutter ablation. He was taken to the EP lab, Dr. Naoma Diener Attempted but aborted right-sided CTI AFL RFA, he performed multiple elective external DC synchronous cardioversion's. Patient converted to sinus bradycardia for 3-4 beats followed by early recurrent Afib.  His ventricular rates were in the 110-120 bpm range. At that point unable to restore sinus rhythm and unable to assess for any ablation after doing some RF lines with questionable contact along the isthmus and a markedly rotated RA he ended the procedure. Dr. Naoma Diener recommended DDDR PPM implantation with cardioversion today by Dr. Anibal Henderson, then initiate Sotalol today after Tikosyn washout. Patient reports incisional pain at left chest PPM site. He denies chest pain, palpitations, SOA, nausea, abdominal or back pain.      Past Medical History  Past Medical History:   Diagnosis Date   ??? A-fib (HCC) 08/18/2015   ??? BPH (benign prostatic hyperplasia) 08/18/2015   ??? Hypertension 08/18/2015   ??? Typical atrial flutter [facility])          Social History  Social History     Social History   ??? Marital status: Married     Spouse name: N/A   ??? Number of children: N/A   ??? Years of education: N/A     Occupational History   ??? Not on file.     Social History Main Topics   ??? Smoking status: Never Smoker ??? Smokeless tobacco: Never Used   ??? Alcohol use No   ??? Drug use: No   ??? Sexual activity: Not on file     Other Topics Concern   ??? Not on file     Social History Narrative   ??? No narrative on file         Family History  Family History   Problem Relation Age of Onset   ??? Hypertension Mother    ??? Depression Mother    ??? Hypertension Father    ??? Cancer Father        Medications    amLODIPine (NORVASC) tablet 2.5 mg 2.5 mg Oral QDAY   apixaban (ELIQUIS) tablet 5 mg 5 mg Oral BID   aspirin EC tablet 81 mg 81 mg Oral QDAY   ceFAZolin (ANCEF) IVP 1 g 1 g Intravenous Q8H*   finasteride (PROSCAR) tablet 5 mg 5 mg Oral QDAY   lidocaine (XYLOCAINE) 2 % jelly  Topical ONCE   losartan (COZAAR) tablet 50 mg 50 mg Oral QDAY   metoprolol XL (TOPROL XL) tablet 25 mg 25 mg Oral QDAY   sotalol (BETAPACE) tablet 120 mg 120 mg Oral Q10H*   tamsulosin (FLOMAX) capsule 0.4 mg 0.4 mg Oral QDAY     acetaminophen Q4H PRN, ALPRAZolam Q6H PRN, aluminum/magnesium hydroxide Q4H PRN, docusate QDAY PRN, HYDROcodone/acetaminophen Q4H PRN, lidocaine PF PRN, midodrine Q4H PRN, milk of magnesia (CONC) QHS PRN, nitroglycerin Q5 MIN PRN, ondansetron (ZOFRAN) IV Q6H PRN    Allergies  No Known Allergies    Review of Systems  A 10 point review of systems was ascertained and is otherwise negative and/or normal except for:  Pain at [facility] left chest incision site as per HPI.                           Vital Signs: Most Recent                 Vital Signs: 24 Hour Range   BP: 115/71 (07/24 1330)  Temp: 36.4 ???C (97.6 ???F) (07/24 1145)  Pulse: 71 (07/24 1330)  Respirations: 23 PER MINUTE (07/24 1330)  SpO2: 97 % (07/24 1330)  O2 Delivery: None (Room Air) (07/24 1145)  SpO2 Pulse: 69 (07/24 1330) BP: (92-174)/(56-136)   Temp:  [36 ???C (96.8 ???F)-37.8 ???C (100 ???F)]   Pulse:  [65-124]   Respirations:  [9 PER MINUTE-23 PER MINUTE]   SpO2:  [95 %-100 %]   O2 Delivery: None (Room Air)     Vitals:    02/18/17 0959   Weight: 90.5 kg (199 lb 8.3 oz) Intake/Output Summary (Last 24 hours) at 02/19/17 1358  Last data filed at 02/19/17 0000   Gross per 24 hour   Intake              825  ml   Output              650 ml   Net              175 ml           Physical Exam   General Appearance: resting comfortably, no acute distress  Skin: warm, moist, no ulcers or xanthomas  Digits and Nails: no clubbing  Eyes: conjunctivae and lids normal, pupils are equal and round  Lips & Oral Mucosa: no pallor or cyanosis  Ear, Nose, Throat: No deformities, posterior oral pharynx clear  Neck Veins: veins are not distended  Chest Inspection:  Left PPM site with Silverlon dressing D&I, No active bleeding or drainage noted. Edges of incision well approximated. No erythema noted. Minimal pocket edema with edges of device easily palpated. Mid chest primapore dressing D&I over explanted ILR site.   Respiratory Effort: breathing is unlabored, no respiratory distress  Auscultation/Percussion: lungs clear to auscultation, no rales, rhonchi, or wheezing  Cardiac Rhythm: regular rhythm and normal rate  Cardiac Auscultation: Normal S1 & S2, no S3 or S4, no rub  Murmurs: no cardiac murmurs   Carotid Arteries: normal carotid upstroke bilaterally, no bruits  Pulses: normal symmetric pedal pulses   Lower Extremity Edema:  right groin site OTA without significant hematoma or bruit noted. Pedal pulses present and normal bilaterally. no lower extremity edema  Abdominal Exam: soft, non-tender, no masses, bowel sounds normal  Gait & Station: normal balance and gait  Muscle Strength: normal strength and tone  Neurologic Exam: neurological assessment grossly intact  Orientation: oriented to time, place and person  Affect & Mood: appropriate and sustained affect  Other: moves all extremities      Labs  Hematology:    Lab Results   Component Value Date    HGB 14.5 02/19/2017    HCT 43.7 02/19/2017    PLTCT 195 02/19/2017    WBC 7.1 02/19/2017    NEUT 75.8 02/04/2017    ANC 5.1 02/04/2017 ALC 1.0 02/04/2017    MCV 91.2 02/19/2017    MCHC 33.3 02/19/2017    MPV 8.3 02/19/2017    RDW 12.9 02/19/2017   , Coagulation:    Lab Results   Component Value Date    INR 1.2 12/18/2016   , General Chemistry:    Lab Results   Component Value Date    NA 138 02/19/2017    K 4.2 02/19/2017    CL 106 02/19/2017    GAP 5 02/19/2017    BUN 24 02/19/2017    CR 1.22 02/19/2017    GLU 107 02/19/2017    GLU 99 02/04/2017    CA 9.6 02/19/2017    ALBUMIN 4.0 02/08/2016    MG 2.2 02/19/2017    TOTBILI 1.10 02/08/2016    and Lipid Profile:   Lab Results   Component Value Date    CHOL 177 02/08/2016    TRIG 46 02/08/2016    HDL 65 02/08/2016    LDL 99 02/08/2016    VLDL 9 02/08/2016       ECG Atrial paced complexes HR 62    Telemetry Afib earlier today, spontaneously converted to sinus rhythm today @ 1241.    02/18/2017 EP study and Typical atrial flutter ablation  Procedure: Attempted but aborted right-sided CTI AFL RFA, Elective external DC synchronous cardioversion  ???  Complications: ???None  ???  Findings: Catheters positioned and has started RF ablation patient went into atrial flutter. ???  The flutter was organized on the right side intermittently but with changing activation and clearly AFIB with fractionation on the left side and started with a PAC from the LA based on the CS activation being earliest compared to the crista or lateral RA wall. ???Also the activation pattern suggested potential passive activation of the RA.  We attempted cardioversion on 3 separate occasions at 300 J followed by 360 J followed by another 360 J. ???We did not restore any sinus rhythm. ???We then moved the patches to an anterior posterior location. ???We again attempted to 360 J cardioversions. ???The second 1 with manual pressure on the anterior patch resulted in conversion to sinus bradycardia for 3-4 beats followed by early recurrent AFIB. ???His ventricular rates are in the 110-120 bpm range.  ??? At that point unable to restore sinus rhythm and unable to assess for any ablation after doing some RF lines with questionable contact along the isthmus and a markedly rotated RA we ended the procedure.  ???  Sheaths: In half [place] SRO long sheath placed via the right femoral vein used for mapping and ablation; 7 French sheath via the right femoral vein for the Cristae???20 pulled deflectable catheter along the lateral wall of the RA and 6 French sheath in the right femoral vein for the CS decapolar deflectable catheter.  ???  Recommendations:???  Please refer to post-procedure note for full recommendations  Pain control as needed  ???  Plan for DDDR permanent pacemaker implantation with cardioversion at that time tomorrow.  Initiation of Sotalol once pacemaker implanted  Discontinuation of Tikosyn    02/19/17 PPM implant, ILR explant, Technically successful cardioversion, but with early return of AFib.   GENERATOR ??? ???    Manufacturer: Medtronic  ??? ???   Device Type:  DDD-PM ??? ???   Model Number: Azure XT DR MRI [id number] ??? ???   Serial Number: [id number] ??? ???   Implant Date: 02/19/2017 ??? ???   Investigational:  No ??? ???   Wireless:  Yes ??? ???   MRI Conditional:  Yes ??? ???   Universal Transmitter:     ??? ???   Remote Connectivity: Cellular Adaptor ??? ???   Remote Monitor Serial Number:   ??? ???   Accessory Serial Number:   ??? ???   Location: Left  ??? ???   Explanted Generator Number:   ??? ???   Explanted Generator Model Number:   ??? ???   Explanted Generator Serial Number:    ??? ??? ???   ERI/EOL Indicator:   ??? ???   ??? ??? ??? ???   ??? ATRIAL LEAD RV LEAD LV LEAD   Manufacturer: Medtronic  Medtronic       Model Number: CapSureFix Novus MRI 1324-40 CapSureFix Novus MRI 1027-25     Serial Number: [id number]     Implant Date: 02/19/2017 02/19/2017     Investigational:  No No     MRI Conditional:  Yes Yes     Lead Fixation: active fixation active fixation     Location: right atrial appendage   RV low septum       Pin Connector: IS1  IS1 Polarity: Bipolar  Bipolar       Coils: ???   ???   Configuration:  ??? ???      Additional Hardware:     ??? ??? ??? ???   LEAD FUNCTION ATRIAL LEAD RV LEAD LV LEAD   Sense: 1.4 (mV) 10.4 (mV)   (mV)  Capture Threshold: <2..0 (V)/ 0.5 (ms) 0.9 (V)/ 0.5 (ms)   (V)/   (ms)   Current: 1.1 (mA) 0.8 (mA)   (mA)   Impedance: 620 (ohms) 965 (ohms)   (ohms)   Output:  3.5@0 .4 3.5@0 .4  @    No Diaph. Stim. @  10 V 10 V   V   ??? ??? ??? ???

## 2017-02-19 NOTE — Progress Notes
Immediate post procedure note:    Procedure:  Dual pacer+ leads, LINQ removal, DC CVRT  Operators:  Gerda Yin D. Artist Beach, M.D.    Complications:  None  Preop Diagnosis: Persistent AF                            SSS  Post op Diagnosis:  Same  Findings: Successful CVRT at 200J but ERAF after 4 A paced beats  Specimens removed:  None  Estimated blood loss:  Minimal    Will b/g sotalol-per Dr Larina Bras

## 2017-02-19 NOTE — Progress Notes
EP Note:   Pt stable s/p Attempted but aborted right-sided CTI AFL RFA, Elective external DC synchronous cardioversion by Dr. Larina Bras.  ECG Afib with PVCs HR 90  Telemetry Afib, PVCs rhythm   VS, labs, and right groin site are stable.   Subjective: "I feel OK." Pt denies chest pain, SOA, palpitations, nausea, abdominal or back pain.   Awake and alert. Denies complaints overnight.     Physical Exam   General Appearance:  no acute distress   Neck Veins: neck veins are not distended  Auscultation/Percussion: lungs clear to auscultation, no rales or rhonchi, no wheezing   Cardiac Auscultation: S1, S2 normal, no rub, no gallop, murmurs   Carotid Arteries: no bruits   Lower Extremity : right groin site OTA and intact without significant hematoma or bruit noted. Pedal pulses present and normal bilaterally. no lower extremity edema   Abdominal Exam: soft, non-tender, non-distended, normal bowel sounds.     Plan:  -Pt stable, and NPO.  -patient had tachy brady syndrome while in the EP lab   -Patient will go to the EP lab this am for PPM implant, DCCV, and ILR explant by Dr. Artist Beach.      Merlyn Albert, APRN-C

## 2017-02-19 NOTE — Progress Notes
4 cardioversions yesterday with early return of A. fib each time.    One cardioversion earlier today with early return of a cast.  Last dose of dofetilide 7/23 AM.  I went by his room at 1 PM and he was spontaneously back in  Sinus/a pace.  We took the opportunity to fully check his atrial lead.  P waves were 4.9 mV and the threshold was 0.5 at 0.4.  Excellent atrial lead function.    He will start sotalol 120 mg p.o. at 7 PM.  Initial dosing will be every 10 hours.  Hopefully he stays in sinus rhythm and will be able to be discharged on the afternoon of Thursday the 26th.

## 2017-02-19 NOTE — H&P (View-Only)
Admission History and Physical Examination      Name:  Stanley Campbell                                             MRN:  [id number]   Admission Date:  02/18/2017                     Assessment/Plan:    Principal Problem:    Typical atrial flutter (HCC)  Active Problems:    Paroxysmal atrial fibrillation (HCC)    Hypertension    BPH (benign prostatic hyperplasia)    Anxiety    S/P ablation of atrial flutter    76 yoM PMH paroxysmal afib, tachy-brady syndrome s/p PPM 7/24, CKD3, BPH, HTN admitted from [facility] for sotalol start.    Paroxysmal Afib s/p cardioversion x4 7/23 & x1 7/24, tachy-brady syndrome s/p PPM 7/24  - last dose tikosyn 7/23  Plan:  - Cardiology EP consulted, starting sotalol this evening  - keep K>4, Mg>2  - PTA toprol xl, eliquis  - post-procedural abx per Cardiology    HTN  - PTA norvasc, losartan    CKD3  - at baseline    BPH  - PTA finasteride, flomax    FEN: No IVF, BMP QD, Cardiac  Code: Full, discussed on admit  PPx: Eliquis  Dispo: Admit to medicine on tele.    Vernetta Honey, DO  MP [facility] 3 [phone]  __________________________________________________________________________________  Primary Care Physician: Rockwell [place]  Verified    Chief Complaint:  sotalol start  History of Present Illness: Stanley KENDZIERSKI is a 76 y.o. male presents of his admission for sotalol start.  He has paroxysmal A. fib and underwent for cardioversions 7???23 that were unsuccessful maintaining sinus rhythm.  He was noted to have tachybradycardia syndrome and underwent pacemaker placement this morning as well and an additional cardioversion.  He spontaneously cardioverted to sinus rhythm.  Currently he has no chest pain, shortness of breath, or palpitations.  Pain at the new PPM site is well controlled with lidocaine patch.    Past Medical History:   Diagnosis Date   ??? A-fib (HCC) 08/18/2015   ??? BPH (benign prostatic hyperplasia) 08/18/2015   ??? Hypertension 08/18/2015 ??? Typical atrial flutter (HCC)      History reviewed. No pertinent surgical history.  Family History   Problem Relation Age of Onset   ??? Hypertension Mother    ??? Depression Mother    ??? Hypertension Father    ??? Cancer Father      Social History     Social History   ??? Marital status: Married     Spouse name: N/A   ??? Number of children: N/A   ??? Years of education: N/A     Social History Main Topics   ??? Smoking status: Never Smoker   ??? Smokeless tobacco: Never Used   ??? Alcohol use No   ??? Drug use: No   ??? Sexual activity: Not on file     Other Topics Concern   ??? Not on file     Social History Narrative   ??? No narrative on file      Immunizations (includes history and patient reported):   There is no immunization history on file for this patient.        Allergies:  Patient has no known allergies.  Medications:  Prescriptions Prior to Admission   Medication Sig   ??? ALPRAZolam (XANAX) 0.25 mg tablet Take 0.25 mg by mouth at bedtime as needed for Anxiety.   ??? amLODIPine (NORVASC) 2.5 mg tablet Take 1 tablet by mouth daily.   ??? apixaban (ELIQUIS) 5 mg tablet Take 1 tablet by mouth twice daily.   ??? aspirin EC 81 mg tablet Take 1 Tab by mouth daily. Take with food.   ??? dofetilide (TIKOSYN) 500 mcg capsule Take 1 capsule by mouth twice daily.   ??? finasteride (PROSCAR) 5 mg tablet Take 5 mg by mouth daily.   ??? losartan(+) (COZAAR) 100 mg tablet Take 50 mg by mouth daily.   ??? metoprolol XL (TOPROL XL) 25 mg extended release tablet Take 25 mg by mouth daily.   ??? midodrine (PROAMATINE) 5 mg tablet Take 1 Tab by mouth every 4 hours as needed (low blood pressure).   ??? tamsulosin (FLOMAX) 0.4 mg capsule Take 0.4 mg by mouth daily. Do not crush, chew or open capsules. Take 30 minutes following the same meal each day.     Review of Systems:  A 14 point review of systems was negative except for: paroxysmal afib, pain at incision site    Physical Exam:  Vital Signs: Last Filed In 24 Hours Vital Signs: 24 Hour Range BP: 122/68 (07/24 1403)  Temp: 36.4 ???C (97.6 ???F) (07/24 1145)  Pulse: 62 (07/24 1400)  Respirations: 12 PER MINUTE (07/24 1400)  SpO2: 98 % (07/24 1400)  O2 Delivery: None (Room Air) (07/24 1145)  SpO2 Pulse: 62 (07/24 1400)  Height: 182.9 cm (72) (07/24 1403) BP: (92-174)/(56-136)   Temp:  [36 ???C (96.8 ???F)-37.8 ???C (100 ???F)]   Pulse:  [62-124]   Respirations:  [9 PER MINUTE-23 PER MINUTE]   SpO2:  [95 %-100 %]   O2 Delivery: None (Room Air)          Physical Exam  General: Alert, cooperative, no distress, appears stated age   HENT: NC, AT, EOMI, PERRL, nares patent without polyps or drainage, throat clear, no exudates or ulcers  Neck: Supple, nontender, no JVD or lymphadenopathy  Resp: Clear to auscultation bilaterally   CV: Regular rate and rhythm, S1, S2 normal, no murmur.  No peripheral edema.  Left chest wall with PPM covered with bandage C/D/I.  GI: Soft, non-tender. Bowel sounds normal.  Skin: Skin color normal. No rashes or lesions.  Normal turgor.   MSK: No atrophy or abnormal strength or tone in the head, neck, spine, ribs, pelvis or extremities.   Left arm in sling.  Neuro: No focal deficits, CN 2-12 grossly intact bilaterally, oriented x3  Psych: normal affect and mood, remote and recent memory intact      Lab/Radiology/Other Diagnostic Tests:  Pertinent labs reviewed  Glucose: (!) 107 (02/19/17 0430)  Pertinent radiology reviewed.  Echo reviewed.  Telemetry reviewed, NSR with intermittent PVCs.

## 2017-02-20 ENCOUNTER — Inpatient Hospital Stay: Admit: 2017-02-20 | Discharge: 2017-02-20 | Payer: MEDICARE

## 2017-02-20 ENCOUNTER — Encounter: Admit: 2017-02-20 | Discharge: 2017-02-20 | Payer: MEDICARE

## 2017-02-20 DIAGNOSIS — I10 Essential (primary) hypertension: ICD-10-CM

## 2017-02-20 DIAGNOSIS — I4891 Unspecified atrial fibrillation: Principal | ICD-10-CM

## 2017-02-20 DIAGNOSIS — I495 Sick sinus syndrome: ICD-10-CM

## 2017-02-20 DIAGNOSIS — Z950 Presence of cardiac pacemaker: ICD-10-CM

## 2017-02-20 DIAGNOSIS — I483 Typical atrial flutter: ICD-10-CM

## 2017-02-20 DIAGNOSIS — N400 Enlarged prostate without lower urinary tract symptoms: ICD-10-CM

## 2017-02-20 LAB — CBC
Lab: 13 % — ABNORMAL LOW (ref >60)
Lab: 15 g/dL (ref 13.5–16.5)
Lab: 207 10*3/uL (ref >60)
Lab: 30 pg (ref 26–34)
Lab: 33 g/dL (ref 32.0–36.0)
Lab: 45 % — ABNORMAL HIGH (ref 40–50)
Lab: 8.4 FL (ref 7–11)
Lab: 9.4 K/UL (ref 4.5–11.0)
Lab: 91 FL (ref 80–100)

## 2017-02-20 LAB — BASIC METABOLIC PANEL
Lab: 136 MMOL/L — ABNORMAL LOW (ref 137–147)
Lab: 4.5 MMOL/L (ref 3.5–5.1)

## 2017-02-20 LAB — PROTIME INR (PT): Lab: 1.2 MMOL/L — ABNORMAL LOW (ref 0.8–1.2)

## 2017-02-20 LAB — MAGNESIUM: Lab: 2.4 mg/dL (ref 1.6–2.6)

## 2017-02-20 NOTE — Med Student Progress Note
General Progress Note    Name:  Stanley Campbell   EAVWU'[id number] Date:  02/20/2017  Admission Date: 02/18/2017  LOS: 1 day                     Assessment/Plan:    Principal Problem:    Typical atrial flutter (HCC)  Active Problems:    Paroxysmal atrial fibrillation (HCC)    Hypertension    BPH (benign prostatic hyperplasia)    Anxiety    S/P ablation of atrial flutter    Paroxysmal atrial fibrillation  S/P ablation of atrial flutter  Tachy Brady Syndrome  -last dose of Tikosyn 02/18/2017   -s/p cardioversion x 4 on 7/23 and x 1 on 7/24  -K is 4.5 today  -Mg is 2.4 today  -tacy-brady syndrome initially noted in EP lab on 02/18/2017  -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  PLAN:  >cardiology-EP consulted, recs appreciated  >continue PTA metoprolol XL  >continue Eliquis 5 mg BID  >Sotalol 120 mg Q6H  >maintian K > 4 and Mg > 2  >continue Tylenol for post-procedure pain    Hypertension  -stable blood pressures in 110-120/70-80  >continue PTA amlodipine 2.5 mg daily  >continue PTA losartan mg daily    CKDIII  -Cr 1.2 today, at baseline    BPH  >continue PTA finasteride   >continue PTA flomax    Anxiety  >alprazolam 0.25 mg Q6H PRN    FEN  >no IVF  >BMP QD replace electrolytes PRN  >cardiac diet     PPX  >Eliquis    DISPO  >continue inpatient care to monitor sotalol start.  ________________________________________________________________________    Subjective  Stanley Campbell is a 76 y.o. male with PMH of atrial fibrillation, BPH, HTN, typical atrial flutter currently admitted for sotalol start. He denies chest pain, shortness of breath, or palpitations this morning. Reports some discomfort at procedure PPM site which worsens with deep breaths, but is alleviated with Tylenol and the lidocaine patch.    Medications  Scheduled Meds:  amLODIPine (NORVASC) tablet 2.5 mg 2.5 mg Oral QDAY   apixaban (ELIQUIS) tablet 5 mg 5 mg Oral BID   aspirin EC tablet 81 mg 81 mg Oral QDAY   finasteride (PROSCAR) tablet 5 mg 5 mg Oral QDAY   losartan (COZAAR) tablet 50 mg 50 mg Oral QDAY   metoprolol XL (TOPROL XL) tablet 25 mg 25 mg Oral QDAY   sotalol (BETAPACE) tablet 120 mg 120 mg Oral Q10H*   tamsulosin (FLOMAX) capsule 0.4 mg 0.4 mg Oral QDAY   Continuous Infusions:  ??? sodium chloride 0.9 %   infusion       PRN and Respiratory Meds:acetaminophen Q4H PRN, ALPRAZolam Q6H PRN, aluminum/magnesium hydroxide Q4H PRN, docusate QDAY PRN, HYDROcodone/acetaminophen Q4H PRN, lidocaine PF PRN, midodrine Q4H PRN, milk of magnesia (CONC) QHS PRN, nitroglycerin Q5 MIN PRN, ondansetron (ZOFRAN) IV Q6H PRN    Review of Systems:  Constitutional: negative  Eyes: negative  Respiratory: negative for cough, sputum, increased work of breathing, wheezing or dyspnea on exertion  Cardiovascular: negative for chest pain, dyspnea, palpitations, irregular heart beats, syncope, fatigue, claudication, does report some chest discomfort at [facility] site  Gastrointestinal: negative for nausea, vomiting,  change in bowel habits, melena, diarrhea, constipation and abdominal pain  Musculoskeletal:negative  Behavioral/Psych: positive for anxiety    Objective:                          Vital Signs: Last Filed                 Vital Signs: 24 Hour Range   BP: 137/88 (07/25 0730)  Temp: 36.3 ???C (97.4 ???F) (07/25 0730)  Pulse: 59 (07/25 0730)  Respirations: 18 PER MINUTE (07/25 0730)  SpO2: 99 % (07/25 0730)  O2 Delivery: None (Room Air) (07/25 0730)  SpO2 Pulse: 65 (07/24 1957)  Height: 182.9 cm (72) (07/24 1403) BP: (96-137)/(56-112)   Temp:  [36.3 ???C (97.4 ???F)-36.6 ???C (97.8 ???F)]   Pulse:  [59-110]   Respirations:  [8 PER MINUTE-23 PER MINUTE] SpO2:  [94 %-100 %]   O2 Delivery: None (Room Air)     Vitals:    02/18/17 0959   Weight: 90.5 kg (199 lb 8.3 oz)       Intake/Output Summary:  (Last 24 hours)    Intake/Output Summary (Last 24 hours) at 02/20/17 0908  Last data filed at 02/20/17 0400   Gross per 24 hour   Intake              840 ml   Output              350 ml   Net              490 ml         Physical Exam  Physical Exam   Constitutional: No distress.   HENT:   Head: Normocephalic and atraumatic.   Mouth/Throat: No oropharyngeal exudate.   Eyes: Conjunctivae and EOM are normal. Pupils are equal, round, and reactive to light. No scleral icterus.   Neck: Normal range of motion. Neck supple.   Cardiovascular: Normal rate and regular rhythm.  Exam reveals no gallop and no friction rub.    No murmur heard.  Pulmonary/Chest: Breath sounds normal. No respiratory distress. He exhibits tenderness.   Tenderness to palpation at [facility] site.   Abdominal: Soft. Bowel sounds are normal. He exhibits no distension. There is no tenderness.   Skin: He is not diaphoretic.     Lab Review  24-hour labs:    Results for orders placed or performed during the hospital encounter of 02/18/17 (from the past 24 hour(s))   PROTIME INR (PT)    Collection Time: 02/20/17  5:00 AM   Result Value Ref Range    INR 1.2 0.8 - 1.2   BASIC METABOLIC PANEL    Collection Time: 02/20/17  5:00 AM   Result Value Ref Range    Sodium 136 (L) 137 - 147 MMOL/L    Potassium 4.5 3.5 - 5.1 MMOL/L    Chloride 105 98 - 110 MMOL/L    CO2 27 21 - 30 MMOL/L    Anion Gap 4 3 - 12    Glucose 112 (H) 70 - 100 MG/DL    Blood Urea Nitrogen 25 7 - 25 MG/DL    Creatinine 1.61 0.4 - 1.24 MG/DL    Calcium 9.8 8.5 - 09.6 MG/DL    eGFR Non African American 59 (L) >60 mL/min    eGFR African American >60 >60 mL/min   CBC    Collection Time: 02/20/17  5:00 AM   Result Value Ref Range  White Blood Cells 9.4 4.5 - 11.0 K/UL    RBC 4.98 4.4 - 5.5 M/UL    Hemoglobin 15.3 13.5 - 16.5 GM/DL    Hematocrit 95.1 40 - 50 % MCV 91.7 80 - 100 FL    MCH 30.8 26 - 34 PG    MCHC 33.6 32.0 - 36.0 G/DL    RDW 88.4 11 - 15 %    Platelet Count 207 150 - 400 K/UL    MPV 8.4 7 - 11 FL   MAGNESIUM    Collection Time: 02/20/17  5:00 AM   Result Value Ref Range    Magnesium 2.4 1.6 - 2.6 mg/dL     Point of Care Testing  (Last 24 hours)  Glucose: (!) 112 (02/20/17 0500)    Radiology and other Diagnostics Review:    Pertinent radiology reviewed.    Vira Browns, [place]

## 2017-02-20 NOTE — Case Management (ED)
Case Management Admission Assessment    NAME:Stanley Campbell                          MRN: [id number]             DOB:[birth year]          AGE: 76 y.o.  ADMISSION DATE: 02/18/2017             DAYS ADMITTED: LOS: 1 day      Today???s Date: 02/20/2017    Source of Information: Patient, wife and EMR       Plan  Plan: CM Assessment, Assist PRN with SW/NCM Services, Discharge Planning for Home Anticipated     SW met with pt and wife for assessment.  Pt lives at home with his wife.  Pt independent with self care.  Wife will transport home.  No anticipated needs.      Patient Address/Phone  [address]  [name] [address]  [phone] (home)     Emergency Contact  Extended Emergency Contact Information  Primary Emergency Contact: [name], [name] [place]  Home Phone: [phone]  Relation: Spouse  Secondary Emergency Contact: [name] [place]  Home Phone: [phone]  Relation: Daughter    Editor, commissioning  Healthcare Directive: Yes, patient has a healthcare directive  Type of Healthcare Directive: Durable power of attorney for healthcare, Healthcare directive, Living Will  Location of Healthcare Directive: Patient does not have it with him/her  Would patient like to fill out a (a new) Editor, commissioning?: N/A  Psych Advance Directive (Psych unit only): No, patient does not have a Social research officer, government  Does the patient need discharge transport arranged?: No  Transportation Name, Phone and Availability #1: Wife, [name] [phone]  Does the patient use Medicaid Transportation?: No    Expected Discharge Date  Expected Discharge Date: 02/22/17    Living Situation Prior to Admission  ? Living Arrangements  Type of Residence: Home, independent  Living Arrangements: Spouse/significant other  How many levels in the residence?: 2  Can patient live on one level if needed?: Yes  Does residence have entry and/or side stairs?: No Assistance needed prior to admit or anticipated on discharge: No  Who provides assistance or could if needed?: Wife  Are they in good health?: Yes  ? Level of Function   Prior level of function: Independent  ? Cognitive Abilities   Cognitive Abilities: Alert and Oriented    Financial Resources  ? Coverage  Primary Insurance: Medicare  Secondary Insurance: No insurance  Additional Coverage: RX    ? Source of Income   Source Of Income: Other retirement income  ? Financial Assistance Needed?       Psychosocial Needs  ? Mental Health  Mental Health History: Yes (Pt states he has anxiety. His wife provides him homeopathic stuff for it. SW encouraged her to keep physician aprised of what she gives him. SW also updated primary team)  ? Substance Use History  Substance Use History Screen: No  ? Other       Current/Previous Services  ? PCP  [name], [place], [phone], [phone]  ? Pharmacy    [facility] [phone] - [place] [place] - [address]  [address]  [place] [address]  Phone: [phone] Fax: [phone]    [facility] - [place], [place] - [address]  [address]  [address]  [place] [address]  Phone: [phone] Fax: [phone]    ? Durable Medical Equipment   Durable Medical Equipment at home: None  ? Home Health  Receiving home health: No  ? Hemodialysis or Peritoneal Dialysis  Undergoing hemodialysis or peritoneal dialysis: No  ? Tube/Enteral Feeds  Receive tube/enteral feeds: No  ? Infusion  Receive infusions: No  ? Private Duty  Private duty help used: No  ? Home and Community Based Services  Home and community based services: No  ? [employer]: N/A  ? Hospice  Hospice: No  ? Outpatient Therapy  PT: No  OT: No  SLP: No  ? Skilled Nursing Facility/Nursing Home  SNF: No  NH: No  ? Inpatient Rehab  IPR: No  ? Long-Term Acute Care Hospital  LTACH: No  ? Acute Hospital Stay  Acute Hospital Stay: In the past Was patient's stay within the last 30 days?: No    Dellia Beckwith, LMSW *[phone]

## 2017-02-20 NOTE — Progress Notes
Patient arrived to room # (32*) via bed accompanied by RN. Patient transferred to the bed without assistance. Bedside safety checks completed. Initial patient assessment completed, refer to flowsheet for details. Admission skin assessment completed by:  , RN    Pressure Injury Present on Hospital Admission (within 24 hours): No    1. Occiput: No  2. Ear: No  3. Scapula: No  4. Spinous Process: No  5. Shoulder: No  6. Elbow: No  7. Iliac Crest: No  8. Sacrum/Coccyx: No  9. Ischial Tuberosity: No  10. Trochanter: No  11. Knee: No  12. Malleolus: No  13. Heel: No  14. Toes: No  15. Assessed for device associated injury No  16. Nursing Nutrition Assessment Completed No    See Doc Flowsheet for additional wound details.     INTERVENTIONS:

## 2017-02-20 NOTE — Progress Notes
Report called and given to [facility] RN.

## 2017-02-20 NOTE — Progress Notes
Pt transferred to [facility] [address] at 2040. Prior to leaving unit incisional sites with scant drainage and no hematoma noted. Pt walking in halls on unit in [place] frequently prior to transfer and pt requiring frequent reminders to not move left arm around despite being in sling. HS eliquis given prior to transfer as ordered. Upon arrival to [facility] 532 hematoma noted at [facility] site. Pressure applied per cardiotech. [facility] RN aware and RN offered other support.

## 2017-02-20 NOTE — Anesthesia Post-Procedure Evaluation
Post-Anesthesia Evaluation    Name: Stanley Campbell      MRN: [id number]     DOB: 02-07-1941     Age: 76 y.o.     Sex: male   __________________________________________________________________________     Procedure Date: 02/19/2017  Procedure: Procedure(s):  INSERTION/ REPLACEMENT PERMANENT PACEMAKER WITH ATRIAL AND VENTRICULAR LEAD  External Cardioversion  Removal Cardiac Event Recorder      Surgeon: Surgeon(s):  Jacolyn Reedy, MD    Post-Anesthesia Vitals         Post Anesthesia Evaluation Note    Evaluation location: other (EP LAB)  Patient participation: recovered; patient participated in evaluation  Level of consciousness: sleepy but conscious    Pain score: 0  Pain management: adequate    Hydration: normovolemia  Temperature: 36.0C - 38.4C  Airway patency: adequate    Perioperative Events  Perioperative events:  no       Post-op nausea and vomiting: no PONV    Postoperative Status  Cardiovascular status: hemodynamically stable  Respiratory status: spontaneous ventilation      Perioperative Events  Perioperative Event: No  Emergency Case Activation: No

## 2017-02-20 NOTE — Progress Notes
PHYSICAL THERAPY  NOTE         Patient denies changes from baseline mobility and reports no history of balance loss or falls in the last three months.  Patient endorses no concerns with mobility at home.  Currently, the patient is ambulating on the unit without difficulty.       Encouraged patient to continue mobilization while in the hospital.  Physical therapy services will be discontinued at this time, please re-consult if the patient has a change in mobility status.        Therapist: Mardene Sayer, PT, DPT  Date: 02/20/2017

## 2017-02-20 NOTE — Progress Notes
Cardiology Daily Progress Note    Stanley Campbell             Admission Date:  02/18/2017                     Assessment/Plan:    Principal Problem:    Typical atrial flutter [facility])  Active Problems:    Paroxysmal atrial fibrillation (HCC)    Hypertension    BPH (benign prostatic hyperplasia)    Anxiety    S/P ablation of atrial flutter    76 y.o. male patient with history of paroxysmal atrial fib, typical atrial flutter, HTN, anxiety, and BPH who was admitted for Sotalol initiation after aborted atrial flutter ablation due to recurrent atrial fibrillation.    Paroxysmal atrial fibrillation/atrial flutter  -On Toprol 25mg  daily, Eliquis 5mg  BID. CHADS-VASc score is 2 (age, HTN).  -02/18/17 Attempted right-sided CTI atrial flutter ablation but atrial fib noted- attempted multiple elective external DC synchronous cardioversions and he converted to sinus bradycardia for 3-4 beats followed by early recurrent Afib. His ventricular rates were in the 110-120 bpm range. Dr. Naoma Diener was unable to assess for any ablation after doing some RF lines with questionable contact along the isthmus and a markedly rotated RA so the procedure was aborted.  -02/19/17 am-ECG noted in Afib with PVCs VR 90. Patient had another cardioversion following pacemaker implant   -Tikosyn discontinued, patient reported his last dose of Tikosyn was on 02/18/17 @ 0700.  -Sotalol 120mg  q10 hours started at 1900 on 02/19/17  -EKG after first dose Sotalol revealed atrial pacing with one intrinsic sinus beat at a rate of 61, QTc (baseline 415 msec).  -EKG at 0700 this am after 2nd dose, revealed A paced rhythm, underlying SR, QTc  ???  Tachy brady syndrome  -Noted in the EP lab on 02/18/17  -s/p Medtronic dual chamber PPM implant, ILR explant 02/19/17  -CXR this am revealed no pneumothorax, adequate lead slack and appropriate placement. Reviewed by Dr. Wallene Huh  -Device check reveals normal function -Tele reveals primarily atrial pacing at 60, occasional intrinsic sinus beats  ???  Hypertension  -SBP 105-120s, diastolic BPs occasionally >100. Has PRN Midodrine ordered.  -On Toprol, Amlodipine 2.5mg  daily, Losartan 50mg  daily  ???  Anxiety  -PTA Xanax PRN for anxiety  ???  Plan:  -Continue Sotalol 120mg  q10 hours, next dose due at 1500  -EKG 2 hours after each dose of Sotalol, x 5 doses  -Continue Eliquis, Toprol XL.  -Daily BMP, Mg. Keep K >/= 4.0 and mag >/= 2.0      Patient seen and plan discussed with Dr. Wallene Huh    Please call cardiology fellow after hours if QTc is not within protocol for next Sotalol dose.   _____________________________________________________________________________    Subjective:  Pt complains of mild chest pain which is worse with inspiration.  Partially relieved with Tylenol.  None currently.  Denies palpitations, dizziness or lightheadedness.      Allergies:  Patient has no known allergies.    Medications:  Scheduled Meds:    amLODIPine (NORVASC) tablet 2.5 mg 2.5 mg Oral QDAY   apixaban (ELIQUIS) tablet 5 mg 5 mg Oral BID   aspirin EC tablet 81 mg 81 mg Oral QDAY   finasteride (PROSCAR) tablet 5 mg 5 mg Oral QDAY   losartan (COZAAR) tablet 50 mg 50 mg Oral QDAY   metoprolol XL (TOPROL XL) tablet 25 mg 25 mg Oral QDAY   sotalol (BETAPACE) tablet 120  mg 120 mg Oral Q10H*   tamsulosin (FLOMAX) capsule 0.4 mg 0.4 mg Oral QDAY   Continuous Infusions:  ??? sodium chloride 0.9 %   infusion       PRN and Respiratory Meds:acetaminophen Q4H PRN, ALPRAZolam Q6H PRN, aluminum/magnesium hydroxide Q4H PRN, docusate QDAY PRN, HYDROcodone/acetaminophen Q4H PRN, lidocaine PF PRN, midodrine Q4H PRN, milk of magnesia (CONC) QHS PRN, nitroglycerin Q5 MIN PRN, ondansetron (ZOFRAN) IV Q6H PRN       Physical Exam:  Vital Signs: Last Filed In 24 Hours Vital Signs: 24 Hour Range   BP: 105/70 (07/25 1138)  Temp: 36.4 ???C (97.5 ???F) (07/25 1138)  Pulse: 71 (07/25 1138)  Respirations: 20 PER MINUTE (07/25 1138) SpO2: 95 % (07/25 1138)  O2 Delivery: None (Room Air) (07/25 1138)  SpO2 Pulse: 65 (07/24 1957) BP: (105-137)/(64-112)   Temp:  [36.3 ???C (97.4 ???F)-36.6 ???C (97.8 ???F)]   Pulse:  [59-81]   Respirations:  [8 PER MINUTE-20 PER MINUTE]   SpO2:  [94 %-99 %]   O2 Delivery: None (Room Air)        Intake/Output Summary: (Last 24 hours)    Intake/Output Summary (Last 24 hours) at 02/20/17 1528  Last data filed at 02/20/17 0400   Gross per 24 hour   Intake              840 ml   Output              350 ml   Net              490 ml      Vitals:    02/18/17 0959   Weight: 90.5 kg (199 lb 8.3 oz)      Physical Exam   General Appearance: well developed, well nourished, appears stated age, no acute distress   Neck Veins: neck veins are not distended   Auscultation/Percussion: lungs clear to auscultation, no rales or rhonchi, no wheezing   Cardiac Auscultation: S1, S2 normal, no rub, no gallop, murmurs    Chest: left chest Silverlon dressing intact with small hematoma  Pedal Pulses: normal symmetric pedal pulses   Lower Extremity : no lower extremity edema   Abdominal Exam: soft, non-tender, non-distended, normal bowel sounds. No abdominal bruits   Neurologic Exam: neurological assessment grossly intact      Laboratory Review:   Hematology:    Lab Results   Component Value Date    HGB 15.3 02/20/2017    HCT 45.7 02/20/2017    PLTCT 207 02/20/2017    WBC 9.4 02/20/2017    NEUT 75.8 02/04/2017    ANC 5.1 02/04/2017    ALC 1.0 02/04/2017    MCV 91.7 02/20/2017    MCHC 33.6 02/20/2017    MPV 8.4 02/20/2017    RDW 13.1 02/20/2017   , General Chemistry:    Lab Results   Component Value Date    NA 136 02/20/2017    K 4.5 02/20/2017    CL 105 02/20/2017    GAP 4 02/20/2017    BUN 25 02/20/2017    CR 1.21 02/20/2017    GLU 112 02/20/2017    GLU 99 02/04/2017    CA 9.8 02/20/2017    ALBUMIN 4.0 02/08/2016    MG 2.4 02/20/2017    TOTBILI 1.10 02/08/2016    and Mg and PO4:   Lab Results   Component Value Date    MG 2.4 02/20/2017 Telemetry: NSR with primarily atrial pacing with rare PACs,  rare VPacing    Echocardiogram 02/19/2017:   1. Normal LV size, wall motion with EF =65% (accurately estimated with Definity Contrast)  2. Normal RV size and function. RA and RV pacemaker lead present.   3. Normal LA & RA size.   4. No significant valvular abnormality.   5. CVP 6-10 mmHg. & PA systolic pressure 29 mmHg.    6. No pericardial effusion.     Chest X-Ray 02/20/2017:    1. ???Left chest dual-lead cardiac conduction device placement as described.  2. ???Mild blunting of the right costophrenic sulcus, which may reflect a   small pleural effusion or scarring.  3. ???Nodular density projecting over the left lower lung, likely   representing a nipple shadow though follow-up chest radiograph with nipple   markers may be useful.    Korbyn Vanes, APRN-NP [phone])

## 2017-02-21 ENCOUNTER — Encounter: Admit: 2017-02-21 | Discharge: 2017-02-21 | Payer: MEDICARE

## 2017-02-21 DIAGNOSIS — I481 Persistent atrial fibrillation: Secondary | ICD-10-CM

## 2017-02-21 LAB — BASIC METABOLIC PANEL
Lab: 133 MMOL/L — ABNORMAL LOW (ref 137–147)
Lab: 1.3 mg/dL — ABNORMAL HIGH (ref 0.4–1.24)
Lab: 103 MMOL/L (ref 98–110)
Lab: 125 mg/dL — ABNORMAL HIGH (ref 70–100)
Lab: 135 MMOL/L — ABNORMAL LOW (ref 137–147)
Lab: 26 MMOL/L (ref 21–30)
Lab: 29 mg/dL — ABNORMAL HIGH (ref 7–25)
Lab: 4.4 MMOL/L (ref 3.5–5.1)
Lab: 52 mL/min — ABNORMAL LOW (ref >60)
Lab: 9.8 mg/dL (ref 8.5–10.6)
Lab: 6 (ref 3–12)

## 2017-02-21 LAB — MAGNESIUM: Lab: 2.6 mg/dL — ABNORMAL LOW (ref >60)

## 2017-02-21 LAB — TROPONIN-I
Lab: 0 ng/mL (ref 0.0–0.05)
Lab: 0 ng/mL (ref 0.0–0.05)
Lab: 0 ng/mL — ABNORMAL LOW (ref 0.0–0.05)

## 2017-02-21 LAB — CBC: Lab: 11 K/UL — ABNORMAL HIGH (ref 4.5–11.0)

## 2017-02-21 LAB — PROTIME INR (PT): Lab: 1.5 g/dL — ABNORMAL HIGH (ref >60)

## 2017-02-21 MED ORDER — LACTATED RINGERS IV SOLP
500 mL | INTRAVENOUS | 0 refills | Status: CP
Start: 2017-02-21 — End: ?
  Administered 2017-02-22: 02:00:00 500 mL via INTRAVENOUS

## 2017-02-21 MED ORDER — INDOMETHACIN 25 MG PO CAP
50 mg | Freq: Three times a day (TID) | ORAL | 0 refills | Status: DC
Start: 2017-02-21 — End: 2017-02-23
  Administered 2017-02-21 – 2017-02-23 (×5): 50 mg via ORAL

## 2017-02-21 MED ORDER — LACTATED RINGERS IV SOLP
500 mL | INTRAVENOUS | 0 refills | Status: CP
Start: 2017-02-21 — End: ?
  Administered 2017-02-21: 13:00:00 500 mL via INTRAVENOUS

## 2017-02-21 NOTE — Progress Notes
Spoke with CV fellow, Dr. Erling Cruz regarding pt's BP and next dose of Sotalol. Okay to give dose of Sotalol as ordered. Pt appearing anxious as well and physician okay with pt's PRN dose of Ativan.

## 2017-02-21 NOTE — Progress Notes
Cardiology Daily Progress Note    Stanley Campbell             Admission Date:  02/18/2017                     Assessment/Plan:    Principal Problem:    Typical atrial flutter (HCC)  Active Problems:    Paroxysmal atrial fibrillation (HCC)    Hypertension    BPH (benign prostatic hyperplasia)    Anxiety    S/P ablation of atrial flutter    Tachy-brady syndrome (HCC)    Presence of cardiac pacemaker    76 y.o. male patient with history of paroxysmal atrial fib, typical atrial flutter, HTN, anxiety, and BPH who was admitted for Sotalol initiation after aborted atrial flutter ablation due to recurrent atrial fibrillation.    Paroxysmal atrial fibrillation/atrial flutter  -On Toprol 25mg  daily, Eliquis 5mg  BID. CHADS-VASc score is 2 (age, HTN).  -02/18/17 Attempted right-sided CTI atrial flutter ablation but atrial fib noted- attempted multiple elective external DC synchronous cardioversions and he converted to sinus bradycardia for 3-4 beats followed by early recurrent Afib. His ventricular rates were in the 110-120 bpm range. Dr. Naoma Diener was unable to assess for any ablation after doing some RF lines with questionable contact along the isthmus and a markedly rotated RA so the procedure was aborted.  -02/19/17 am-ECG noted in Afib with PVCs VR 90. Patient had another cardioversion following pacemaker implant   -Tikosyn discontinued, patient reported his last dose of Tikosyn was on 02/18/17 @ 0700.  -Sotalol 120mg  q10 hours started at 1900 on 02/19/17  -EKG after first dose Sotalol revealed atrial pacing with one intrinsic sinus beat at a rate of 61, QTc (baseline 415 msec).  -EKG at 0700 this am after 2nd dose, revealed A paced rhythm, underlying SR, QTc  ???  Tachy brady syndrome  -Noted in the EP lab on 02/18/17  -s/p [employer] dual chamber PPM implant, ILR explant 02/19/17  -CXR this am revealed no pneumothorax, adequate lead slack and appropriate placement. Reviewed by Dr. Wallene Huh -Device check reveals normal function   -Tele reveals primarily atrial pacing at 60, occasional intrinsic sinus beats  ???  Hypertension  -SBP 105-120s, diastolic BPs occasionally >100. Has PRN Midodrine ordered.  -SBP since 2330 80s-118  -On Toprol, Amlodipine 2.5mg  daily, Losartan 50mg  daily (Hold amlodipine and losartan 02/21/17 d/t low SBP and AKI).  ???  Anxiety  -PTA Xanax PRN for anxiety    ST elevation  -ECG obtained at 0303 after Sotalol revealed ST segment elevation in anterior, inferior, and lateral leads. On review the prior ECG obtained at 1715 on 02/20/17 demonstrated similar changes, but less pronounced. The patient is s/p attempted right-sided CTI atrial flutter ablation on 02/18/17 and pacemaker insertion 02/19/2017. He has no known history of CAD.   -Pt was asymptomatic  -Pt had normal vital signs, complete lack of symptoms, and the diffuse nature of his ST segment elevation , the cardiology fellow Dr. Sandria Manly, thinks this to be more consistent with diffuse pericardial inflammation. This would also correlate with recent ablation and PPM insertion. While acute myocardial injury is possible, he felt it is a much less likely explanation for his ECG changes.   -troponin 02/21/17 0.05 > 0.04    AKI  -Creatine was 1.21 on 02/20/17 and elevated to 1.51 today    Leukocytosis  -WBC was 9.4 on 02/20/17 and elevated to 11.7 today  pt is afebrile, denies chills or  diaphoresis, he reports he feels fine.  ???  Plan:  -trend troponin last one 0.05, negative x 3   -Echo this am shows: In comparison to previous echocardiogram from 02/19/17, there is a small pericardial effusion seen on this study. It does not appear to be hemodynamically significant, see details below.   -Started Indomethacin 50 mg PO Tid today for signs and symptoms of pericarditis.  -Agree with IVF bolus for AKI.  -repeat BMP @ 1130 shows creatine 1.35, crcl 61 (OK to continue Sotalol 120 mg Bid). -Post 4th dose Sotalol QTc 458 msec, K+ 4.7, Mg 2.6, Cr 1.51, Crcl 55   -Continue Sotalol 120mg  q10 hours, next dose due at 1100  -EKG 2 hours after each dose of Sotalol, x 5 doses  -Continue Eliquis, Toprol XL.  -Hold amlodipine and losartan 02/21/17 d/t low SBP and AKI).  -Daily BMP, Mg. Keep K >/= 4.0 and mag >/= 2.0   -CT chest w/o contrast today to assess pericardial effusion and lead placement.   -Limited echo in am to assess pericardial effusion.  Addendum:  -Please call cardiology fellow after hours if QTc is not within protocol for next Sotalol dose. (After we start sotalol, hopefully tomorrow after TEE, check electrolytes, and check QTc.     Patient seen and plan discussed with Dr. Wallene Huh.     Lysle Morales, APRN-C   _____________________________________________________________________________    Subjective:  Pt reports mild chest pain which is worse with inspiration, which is less today than yesterday. None currently.  Denies SOA, palpitations, dizziness or lightheadedness. Pt is afebrile, denies chills or diaphoresis, he reports he feels this am except for mild chest pain at [facility] site with deep inspirations.       Allergies:  Patient has no known allergies.    Medications:  Scheduled Meds:    apixaban (ELIQUIS) tablet 5 mg 5 mg Oral BID   aspirin EC tablet 81 mg 81 mg Oral QDAY   finasteride (PROSCAR) tablet 5 mg 5 mg Oral QDAY   metoprolol XL (TOPROL XL) tablet 25 mg 25 mg Oral QDAY   sotalol (BETAPACE) tablet 120 mg 120 mg Oral Q10H*   tamsulosin (FLOMAX) capsule 0.4 mg 0.4 mg Oral QDAY   Continuous Infusions:    PRN and Respiratory Meds:acetaminophen Q4H PRN, ALPRAZolam Q6H PRN, aluminum/magnesium hydroxide Q4H PRN, docusate QDAY PRN, HYDROcodone/acetaminophen Q4H PRN, lidocaine PF PRN, milk of magnesia (CONC) QHS PRN, nitroglycerin Q5 MIN PRN, ondansetron (ZOFRAN) IV Q6H PRN       Physical Exam:  Vital Signs: Last Filed In 24 Hours Vital Signs: 24 Hour Range   BP: 118/80 (07/26 0750) Temp: 36.5 ???C (97.7 ???F) (07/26 0750)  Pulse: 112 (07/26 0750)  Respirations: 18 PER MINUTE (07/26 0750)  SpO2: 97 % (07/26 0750)  O2 Delivery: None (Room Air) (07/26 0750)  SpO2 Pulse: 90 (07/26 0006) BP: (88-118)/(55-80)   Temp:  [36.4 ???C (97.5 ???F)-36.7 ???C (98 ???F)]   Pulse:  [60-112]   Respirations:  [18 PER MINUTE-20 PER MINUTE]   SpO2:  [92 %-100 %]   O2 Delivery: None (Room Air)   Intensity Pain Scale 0-10 (Pain 1): 3 (02/20/17 4782)    Intake/Output Summary: (Last 24 hours)    Intake/Output Summary (Last 24 hours) at 02/21/17 0824  Last data filed at 02/21/17 0306   Gross per 24 hour   Intake             1000 ml   Output  0 ml   Net             1000 ml      Vitals:    02/18/17 0959   Weight: 90.5 kg (199 lb 8.3 oz)      Physical Exam   General Appearance: well developed, well nourished, appears stated age, no acute distress   Neck Veins: neck veins are not distended   Auscultation/Percussion: lungs clear to auscultation, no rales or rhonchi, no wheezing   Cardiac Auscultation: S1, S2 normal, no rub, no gallop, murmurs    Chest: left chest Silverlon dressing intact with small hematoma, primapore dressing D&I over left explanted ILR site.   Pedal Pulses: normal symmetric pedal pulses   Lower Extremity: right groin site OTA without significant hematoma or bruit noted. Pedal pulses present and normal bilaterally.   No lower extremity edema   Abdominal Exam: soft, non-tender, non-distended, normal bowel sounds.   Neurologic Exam: neurological assessment grossly intact      Laboratory Review:   Hematology:    Lab Results   Component Value Date    HGB 13.5 02/21/2017    HCT 40.2 02/21/2017    PLTCT 242 02/21/2017    WBC 11.7 02/21/2017    NEUT 75.8 02/04/2017    ANC 5.1 02/04/2017    ALC 1.0 02/04/2017    MCV 91.3 02/21/2017    MCHC 33.5 02/21/2017    MPV 8.8 02/21/2017    RDW 13.0 02/21/2017   , General Chemistry:    Lab Results   Component Value Date    NA 133 02/21/2017    K 4.7 02/21/2017 CL 103 02/21/2017    GAP 4 02/21/2017    BUN 29 02/21/2017    CR 1.51 02/21/2017    GLU 124 02/21/2017    GLU 99 02/04/2017    CA 9.6 02/21/2017    ALBUMIN 4.0 02/08/2016    MG 2.6 02/21/2017    TOTBILI 1.10 02/08/2016    and Mg and PO4:   Lab Results   Component Value Date    MG 2.6 02/21/2017       Telemetry: Converted back to Afib at approximately 1946 last night HR 90s-120s    Repeat Echo 02/21/2017  Interpretation Summary     ??? Chamber sizes are normal  ??? Normal left ventricular systolic function, with an ejection fraction estimated at 55%  ??? Atrial fibrillation  ??? No hemodynamically significant valvular abnormalities   ??? Pulmonary artery pressure is estimated at 38 mmHg  ???  In comparison to previous echocardiogram from 02/19/17, there is a small pericardial effusion seen on this study. It does not appear to be hemodynamically significant.  ???     Echocardiogram 02/19/2017:   1. Normal LV size, wall motion with EF =65% (accurately estimated with Definity Contrast)  2. Normal RV size and function. RA and RV pacemaker lead present.   3. Normal LA & RA size.   4. No significant valvular abnormality.   5. CVP 6-10 mmHg. & PA systolic pressure 29 mmHg.    6. No pericardial effusion.     Chest X-Ray 02/20/2017:    1. ???Left chest dual-lead cardiac conduction device placement as described.  2. ???Mild blunting of the right costophrenic sulcus, which may reflect a   small pleural effusion or scarring.  3. ???Nodular density projecting over the left lower lung, likely   representing a nipple shadow though follow-up chest radiograph with nipple   markers may be useful.  Lysle Morales, APRN [phone]

## 2017-02-21 NOTE — Progress Notes
General Progress Note    Name:  Stanley Campbell   [id number] Date:  02/21/2017  Admission Date: 02/18/2017  LOS: 2 days                     Assessment/Plan:    Principal Problem:    Typical atrial flutter (HCC)  Active Problems:    Paroxysmal atrial fibrillation (HCC)    Hypertension    BPH (benign prostatic hyperplasia)    Anxiety    S/P ablation of atrial flutter    Tachy-brady syndrome (HCC)    Presence of cardiac pacemaker    ???  Paroxysmal atrial fibrillation. S/P ablation of atrial flutter, Tachy Brady Syndrome  -last dose of Tikosyn 02/18/2017   -s/p cardioversion x 4 on 7/23 and x 1 on 7/24  -K is 4.5 today  -Mg is 2.4 today  -tacy-brady syndrome initially noted in EP lab on 02/18/2017  -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  - 7/25 went back into Atrial fibrillation in the Evening, EKG with diffuse ST elevation - pericarditis vs. ischemia  PLAN:  >cardiology-EP consulted, recs appreciated  >continue PTA metoprolol XL  >continue Eliquis 5 mg BID  >Sotalol 120 mg Q6H  >maintian K > 4 and Mg > 2  >continue Tylenol for post-procedure pain  > repeat echo today pending, trending troponin  >made NPO for possible cardiology procedure given recurrence of afib and chest pain    ???  Hypertension/hypotension overnight  -stable blood pressures in 110-120/70-80  >PTA amlodipine 2.5 mg daily - hold with AKI, hypotension  >PTA losartan mg daily - hold with AKI, Hypotension  - remove Midodrine from [facility]   ???  CKDIII, now with AKI  -Cr 1.2 today, at baseline  - Cr. 1.5 up from 1.2  - hold ARB, amlodipine  - give 500cc bolus  - had hypotensive episode overnight with lower BPs    BPH >continue PTA finasteride   >continue PTA flomax  ???  Anxiety  >alprazolam 0.25 mg Q6H PRN    ???    Fluids: LR 500cc  Diet: Advance Diet as Tolerated  DIET CARDIAC(LOW FAT/LOW SODIUM)     DVT PPX: eliquis    Full Code    Dispo: Continue inpatient admission for arrhythmia management .    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       ________________________________________________________________________    Subjective  Stanley Campbell is a 76 y.o. male.  Patient this AM reports that he was symptomatic overnight with low BP.   He denies chest pain this AM, no palpitations.    No dyspnea.    N    Review of Systems:    General: no f/c  Cardio: no cp  Pulm: no dyspne  GI:no n/v, pos for decreased appetite    Medications  Scheduled Meds:  apixaban (ELIQUIS) tablet 5 mg 5 mg Oral BID   aspirin EC tablet 81 mg 81 mg Oral QDAY   finasteride (PROSCAR) tablet 5 mg  5 mg Oral QDAY   metoprolol XL (TOPROL XL) tablet 25 mg 25 mg Oral QDAY   sotalol (BETAPACE) tablet 120 mg 120 mg Oral Q10H*   tamsulosin (FLOMAX) capsule 0.4 mg 0.4 mg Oral QDAY   Continuous Infusions:  PRN and Respiratory Meds:acetaminophen Q4H PRN, ALPRAZolam Q6H PRN, aluminum/magnesium hydroxide Q4H PRN, docusate QDAY PRN, HYDROcodone/acetaminophen Q4H PRN, lidocaine PF PRN, midodrine Q4H PRN, milk of magnesia (CONC) QHS PRN, nitroglycerin Q5 MIN PRN, ondansetron (ZOFRAN) IV Q6H PRN      Objective:                          Vital Signs: Last Filed                 Vital Signs: 24 Hour Range   BP: 118/80 (07/26 0750)  Temp: 36.5 ???C (97.7 ???F) (07/26 0750)  Pulse: 112 (07/26 0750)  Respirations: 18 PER MINUTE (07/26 0750)  SpO2: 97 % (07/26 0750)  O2 Delivery: None (Room Air) (07/26 0750)  SpO2 Pulse: 90 (07/26 0006) BP: (88-118)/(55-80) Temp:  [36.4 ???C (97.5 ???F)-36.7 ???C (98 ???F)]   Pulse:  [60-112]   Respirations:  [18 PER MINUTE-20 PER MINUTE]   SpO2:  [92 %-100 %]   O2 Delivery: None (Room Air)   Intensity Pain Scale 0-10 (Pain 1): 3 (02/20/17 4540) Vitals:    02/18/17 0959   Weight: 90.5 kg (199 lb 8.3 oz)       Intake/Output Summary:  (Last 24 hours)    Intake/Output Summary (Last 24 hours) at 02/21/17 0759  Last data filed at 02/21/17 0306   Gross per 24 hour   Intake             1000 ml   Output                0 ml   Net             1000 ml      Stool Occurrence: 1    Physical Exam  General appearance: alert, cooperative and no distress  Neurologic: Grossly normal  Lungs: clear to auscultation bilaterally  Heart: irregularly irregular rhythm, no murmur  Chest: left chest w/device, minimal tenderness surrounding area with dressing in place  Abdomen: soft, non-tender. Bowel sounds normal. No masses,  no organomegaly  Extremities: extremities normal, atraumatic, no cyanosis or edema    Lab Review  24-hour labs:    Results for orders placed or performed during the hospital encounter of 02/18/17 (from the past 24 hour(s))   PROTIME INR (PT)    Collection Time: 02/21/17  3:10 AM   Result Value Ref Range    INR 1.5 (H) 0.8 - 1.2   BASIC METABOLIC PANEL    Collection Time: 02/21/17  3:10 AM   Result Value Ref Range    Sodium 133 (L) 137 - 147 MMOL/L    Potassium 4.7 3.5 - 5.1 MMOL/L    Chloride 103 98 - 110 MMOL/L    CO2 26 21 - 30 MMOL/L    Anion Gap 4 3 - 12    Glucose 124 (H) 70 - 100 MG/DL    Blood Urea Nitrogen 29 (H) 7 - 25 MG/DL    Creatinine 9.81 (H) 0.4 - 1.24 MG/DL    Calcium 9.6 8.5 - 19.1 MG/DL    eGFR Non African American 46 (L) >60 mL/min    eGFR African American 55 (L) >60 mL/min  CBC    Collection Time: 02/21/17  3:10 AM   Result Value Ref Range    White Blood Cells 11.7 (H) 4.5 - 11.0 K/UL    RBC 4.41 4.4 - 5.5 M/UL    Hemoglobin 13.5 13.5 - 16.5 GM/DL    Hematocrit 16.1 40 - 50 %    MCV 91.3 80 - 100 FL    MCH 30.6 26 - 34 PG MCHC 33.5 32.0 - 36.0 G/DL    RDW 09.6 11 - 15 %    Platelet Count 242 150 - 400 K/UL    MPV 8.8 7 - 11 FL   MAGNESIUM    Collection Time: 02/21/17  3:10 AM   Result Value Ref Range    Magnesium 2.6 1.6 - 2.6 mg/dL   TROPONIN-I    Collection Time: 02/21/17  3:10 AM   Result Value Ref Range    Troponin-I 0.05 0.0 - 0.05 NG/ML   TROPONIN-I    Collection Time: 02/21/17  6:59 AM   Result Value Ref Range    Troponin-I 0.04 0.0 - 0.05 NG/ML         Radiology and other Diagnostics Review:    Pertinent radiology reviewed.    No results found.    Landry Mellow, DO   Pager

## 2017-02-21 NOTE — Progress Notes
Pt back in aflutter rhythm with HR in the low 100s. Dr. Erling Cruz, CV fellow notified. Continue dose as ordered.

## 2017-02-22 ENCOUNTER — Ambulatory Visit: Admit: 2017-02-22 | Discharge: 2017-02-22 | Payer: MEDICARE

## 2017-02-22 ENCOUNTER — Inpatient Hospital Stay: Admit: 2017-02-22 | Discharge: 2017-02-22 | Payer: MEDICARE

## 2017-02-22 LAB — CBC
Lab: 4.1 M/UL — ABNORMAL LOW (ref >60)
Lab: 8.2 K/UL — ABNORMAL LOW (ref >60)

## 2017-02-22 LAB — MAGNESIUM: Lab: 2.3 mg/dL — ABNORMAL LOW (ref >60)

## 2017-02-22 LAB — BASIC METABOLIC PANEL: Lab: 136 MMOL/L — ABNORMAL LOW (ref 137–147)

## 2017-02-22 LAB — PROTIME INR (PT): Lab: 1.6 FL — ABNORMAL HIGH (ref 0.8–1.2)

## 2017-02-22 MED ORDER — SOTALOL 120 MG PO TAB
120 mg | Freq: Two times a day (BID) | ORAL | 0 refills | Status: DC
Start: 2017-02-22 — End: 2017-02-23
  Administered 2017-02-23 (×2): 120 mg via ORAL

## 2017-02-22 NOTE — Progress Notes
Cardiology Daily Progress Note    Stanley Campbell             Admission Date:  02/18/2017                     Assessment/Plan:    Principal Problem:    Typical atrial flutter (HCC)  Active Problems:    Paroxysmal atrial fibrillation (HCC)    Hypertension    BPH (benign prostatic hyperplasia)    Anxiety    S/P ablation of atrial flutter    Tachy-brady syndrome (HCC)    Presence of cardiac pacemaker    76 y.o. male patient with history of paroxysmal atrial fib, typical atrial flutter, HTN, anxiety, and BPH who was admitted for Sotalol initiation after aborted atrial flutter ablation due to recurrent atrial fibrillation.    Paroxysmal atrial fibrillation/atrial flutter  -On Toprol 25mg  daily, Eliquis 5mg  BID. CHADS-VASc score is 2 (age, HTN).  -02/18/17 Attempted right-sided CTI atrial flutter ablation but atrial fib noted- attempted multiple elective external DC synchronous cardioversions and he converted to sinus bradycardia for 3-4 beats followed by early recurrent Afib. His ventricular rates were in the 110-120 bpm range. Dr. Naoma Diener was unable to assess for any ablation after doing some RF lines with questionable contact along the isthmus and a markedly rotated RA so the procedure was aborted.  -02/19/17 am-ECG noted in Afib with PVCs VR 90. Patient had another cardioversion following pacemaker implant   -Tikosyn discontinued, patient reported his last dose of Tikosyn was on 02/18/17 @ 0700.  -Sotalol 120mg  q10 hours started at 1900 on 02/19/17  -EKG after first dose Sotalol revealed atrial pacing with one intrinsic sinus beat at a rate of 61, QTc (baseline 415 msec).  -EKG at 0700 this am after 2nd dose, revealed A paced rhythm, underlying SR, QTc  ???-Post 4th dose Sotalol QTc 458 msec, K+ 4.7, Mg 2.6, Cr 1.51, Crcl 55   -Post 5th dose Sotalol on 02/21/17 QTc 468 msec, K+ 4.4, Mg 2.6, Cr 1.35, Crcl 61     Tachy brady syndrome  -Noted in the EP lab on 02/18/17 -s/p [employer] dual chamber PPM implant, ILR explant 02/19/17  -CXR this am revealed no pneumothorax, adequate lead slack and appropriate placement. Reviewed by Dr. Wallene Huh  -Device check reveals normal function   -Tele reveals primarily atrial pacing at 60, occasional intrinsic sinus beats  ???  Hypertension  -SBP 105-120s, diastolic BPs occasionally >100. Has PRN Midodrine ordered.  -SBP since 2330 80s-118  -On Toprol, Amlodipine 2.5mg  daily, Losartan 50mg  daily (Hold amlodipine and losartan 02/21/17 d/t low SBP and AKI).  ???  Anxiety  -PTA Xanax PRN for anxiety    ST elevation  -ECG obtained at 0303 after Sotalol revealed ST segment elevation in anterior, inferior, and lateral leads. On review the prior ECG obtained at 1715 on 02/20/17 demonstrated similar changes, but less pronounced. The patient is s/p attempted right-sided CTI atrial flutter ablation on 02/18/17 and pacemaker insertion 02/19/2017. He has no known history of CAD.   -Pt was asymptomatic  -Pt had normal vital signs, complete lack of symptoms, and the diffuse nature of his ST segment elevation , the cardiology fellow Dr. Sandria Manly, thinks this to be more consistent with diffuse pericardial inflammation. This would also correlate with recent ablation and PPM insertion. While acute myocardial injury is possible, he felt it is a much less likely explanation for his ECG changes.   -troponin 02/21/17 0.05 > 0.04 <  0.05, negative x 3     AKI  -Creatine was 1.21 on 02/20/17 and elevated on 02/21/17 to 1.51 > 1.35 < 1.54 today     Leukocytosis  -WBC was 9.4 on 02/20/17 and elevated to 11.7 today  pt is afebrile, denies chills or diaphoresis, he reports he feels fine.  -resolved today WBC 8.2 on 02/22/17    Pericardial effusion  -Echo 02/21/17 shows: In comparison to previous echocardiogram from 02/19/17, there is a small pericardial effusion seen on this study. It does not appear to be hemodynamically significant, see details below.     Hypotension -SBP 70s-90s yesterday @ 2016 through 0325 this am. SBP 130 @ 0800 this am.  ???  Plan:  -Patient had IVF 500 ml bolus last evening due to hypotension SBP was 72 and improved to 95 after IVF.  -Decrease Toprol XL from 25 mg daily  -CT chest w/o contrast today shows:  A small, complex pericardial effusion and/or pericardial thickening. Trace bilateral pleural effusions with mild bibasilar atelectasis, greater on the right.  -Limited echo this am to assess pericardial effusion shows: Trivial circumferential pericardial effusion. Effusion is fluid, see details below.  -Continue Indomethacin 50 mg PO Tid today for signs and symptoms of pericarditis.  -repeat BMP @ 1130 shows creatine 1.35, crcl 61 (OK to continue Sotalol 120 mg Bid).   -Continue Sotalol 120mg  q 12 hours  -Continue Eliquis  -Hold amlodipine and losartan 02/21/17 d/t low SBP and AKI).  -Daily BMP, Mg. Keep K >/= 4.0 and mag >/= 2.0   -Follow up PPM site check on 02/26/17 @ [facility] already scheduled.   -Patient will have ECG on 02/26/17 at site check follow up and if still in Afib, they will schedule outpatient Cardioversion.      Patient seen and plan discussed with Dr. Wallene Huh.     Lysle Morales, APRN-C   _____________________________________________________________________________    Subjective:  Pt denies any chest pain with inspiration today.  Denies SOA, palpitations, dizziness or lightheadedness.     Allergies:  Patient has no known allergies.    Medications:  Scheduled Meds:    apixaban (ELIQUIS) tablet 5 mg 5 mg Oral BID   aspirin EC tablet 81 mg 81 mg Oral QDAY   finasteride (PROSCAR) tablet 5 mg 5 mg Oral QDAY   indomethacin (INDOCIN) capsule 50 mg 50 mg Oral TID w/ meals   metoprolol XL (TOPROL XL) tablet 25 mg 25 mg Oral QDAY   sotalol (BETAPACE) tablet 120 mg 120 mg Oral Q10H*   tamsulosin (FLOMAX) capsule 0.4 mg 0.4 mg Oral QDAY   Continuous Infusions:    PRN and Respiratory Meds:acetaminophen Q4H PRN, ALPRAZolam Q6H PRN, aluminum/magnesium hydroxide Q4H PRN, docusate QDAY PRN, HYDROcodone/acetaminophen Q4H PRN, milk of magnesia (CONC) QHS PRN, nitroglycerin Q5 MIN PRN, ondansetron (ZOFRAN) IV Q6H PRN       Physical Exam:  Vital Signs: Last Filed In 24 Hours Vital Signs: 24 Hour Range   BP: 130/89 (07/27 0800)  Temp: 36.3 ???C (97.3 ???F) (07/27 0800)  Pulse: 74 (07/27 0800)  Respirations: 16 PER MINUTE (07/27 0800)  SpO2: 97 % (07/27 0800)  O2 Delivery: None (Room Air) (07/27 0800)  Height: 182.9 cm (6') (07/26 0905) BP: (75-130)/(48-89)   Temp:  [36.3 ???C (97.3 ???F)-36.8 ???C (98.2 ???F)]   Pulse:  [58-93]   Respirations:  [16 PER MINUTE]   SpO2:  [92 %-98 %]   O2 Delivery: None (Room Air)  Intake/Output Summary: (Last 24 hours)    Intake/Output Summary (Last 24 hours) at 02/22/17 0819  Last data filed at 02/22/17 0800   Gross per 24 hour   Intake           531.36 ml   Output              175 ml   Net           356.36 ml      Vitals:    02/18/17 0959 02/21/17 0905   Weight: 90.5 kg (199 lb 8.3 oz) 90.3 kg (199 lb 1.2 oz)      Physical Exam   General Appearance: well developed, well nourished, appears stated age, no acute distress   Neck Veins: neck veins are not distended   Auscultation/Percussion: lungs clear to auscultation, no rales or rhonchi, no wheezing   Cardiac Auscultation: irregularly, irregular rhythm, normal rate since MN, S1, S2 normal, no rub, no gallop, murmurs    Chest: left chest Silverlon dressing intact with small hematoma, primapore dressing D&I over left explanted ILR site.   Pedal Pulses: normal symmetric pedal pulses   Lower Extremity: right groin site OTA without significant hematoma or bruit noted. Pedal pulses present and normal bilaterally.   No lower extremity edema   Abdominal Exam: soft, non-tender, non-distended, normal bowel sounds.   Neurologic Exam: neurological assessment grossly intact      Laboratory Review:   Hematology:    Lab Results   Component Value Date    HGB 12.8 02/22/2017 HCT 37.8 02/22/2017    PLTCT 178 02/22/2017    WBC 8.2 02/22/2017    NEUT 75.8 02/04/2017    ANC 5.1 02/04/2017    ALC 1.0 02/04/2017    MCV 90.9 02/22/2017    MCHC 33.8 02/22/2017    MPV 9.1 02/22/2017    RDW 12.8 02/22/2017   , General Chemistry:    Lab Results   Component Value Date    NA 136 02/22/2017    K 4.5 02/22/2017    CL 104 02/22/2017    GAP 7 02/22/2017    BUN 37 02/22/2017    CR 1.54 02/22/2017    GLU 110 02/22/2017    GLU 99 02/04/2017    CA 9.5 02/22/2017    ALBUMIN 4.0 02/08/2016    MG 2.3 02/22/2017    TOTBILI 1.10 02/08/2016    and Mg and PO4:   Lab Results   Component Value Date    MG 2.3 02/22/2017       Telemetry:  Afib with V paced complexes HR 60s-120s    Repeat Echo 02/21/2017  Interpretation Summary     ??? Chamber sizes are normal  ??? Normal left ventricular systolic function, with an ejection fraction estimated at 55%  ??? Atrial fibrillation  ??? No hemodynamically significant valvular abnormalities   ??? Pulmonary artery pressure is estimated at 38 mmHg  ???  In comparison to previous echocardiogram from 02/19/17, there is a small pericardial effusion seen on this study. It does not appear to be hemodynamically significant.  ???     Echocardiogram 02/19/2017:   1. Normal LV size, wall motion with EF =65% (accurately estimated with Definity Contrast)  2. Normal RV size and function. RA and RV pacemaker lead present.   3. Normal LA & RA size.   4. No significant valvular abnormality.   5. CVP 6-10 mmHg. & PA systolic pressure 29 mmHg.    6. No pericardial effusion.  Chest X-Ray 02/20/2017:    1. ???Left chest dual-lead cardiac conduction device placement as described.  2. ???Mild blunting of the right costophrenic sulcus, which may reflect a   small pleural effusion or scarring.  3. ???Nodular density projecting over the left lower lung, likely   representing a nipple shadow though follow-up chest radiograph with nipple   markers may be useful.    02/22/2017 CT chest w/o contrast  IMPRESSION 1. A small, complex pericardial effusion and/or pericardial thickening.  2. Trace bilateral pleural effusions with mild bibasilar atelectasis,   greater on the right.  3. Several sub-5 mm nodular opacities throughout the right lung likely   representing scarring and/or granulomas. Follow-up CT chest in 12 months   is recommended to evaluate for stability in a high-risk patient. If   patient is considered low risk for malignancy, these nodules require no   further follow-up.  4. Coronary artery calcifications.    02/22/2017 Limited Echo  Interpretation Summary     Atrial fibrillation during image acquisition  Trivial circumferential pericardial effusion  Device leads in right-sided chambers  Mild bi-atrial enlargement       Lysle Morales, APRN [phone]

## 2017-02-22 NOTE — Case Management (ED)
Case Management Progress Note    NAME:Stanley Campbell                          MRN: [id number]              DOB:04/24/1941          AGE: 76 y.o.  ADMISSION DATE: 02/18/2017             DAYS ADMITTED: LOS: 3 days      Todays Date: 02/22/2017    Plan: Discharge home when medically stable.    Interventions  ? Support      ? Info or Referral      ? Discharge Planning   Discussed pt today in MPN huddle. No dc today.   Pt with small pericardial effusion and pericarditis. Still in A fib. Pt had CT chest and echo today. Pt continuing with PO sotalol. Awaiting further cardiology recommendations.    No NCM needs at this time. Page Nurse Case Manager on call with any needs over the weekend.      ? Medication Needs      ? Financial      ? Legal      ? Other        Disposition  ? Expected Discharge Date    Expected Discharge Date: 02/24/17  ? Transportation   Does the patient need discharge transport arranged?: No  Transportation Name, Phone and Availability #1: Wife, [name] [phone]  Does the patient use Medicaid Transportation?: No  ? Next Level of Care (Acute Psych discharges only)      ? Discharge Disposition                                          Durable Medical Equipment     No service has been selected for the patient.      [place] [facility]     No service has been selected for the patient.      [facility]     No service has been selected for the patient.      [facility]     No service has been selected for the patient.          Pincus Large, BSN RN  Nurse Case Astronomer Text  Phone: [phone]  Pager: [phone]  Cell: [phone]

## 2017-02-22 NOTE — Progress Notes
Patients blood pressure 75/54 upon first check. Upon recheck it was 89/48. MPN paged and Dr. Cicero Duck notified. Lactated ringers 533mL bolus ordered x1. Will continue to monitor.

## 2017-02-22 NOTE — Progress Notes
Name:  Stanley Campbell                                               MRN:  [id number]   Admission Date:  02/18/2017  Today's Date: 02/22/17  LOS: 3      ASSESSMENT AND PLAN     76 y.o. male with PMH of atrial fibrillation, BPH, HTN, typical atrial flutter currently admitted for sotalol start.    Paroxysmal atrial fibrillation. 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  - 7/25 went back into Atrial fibrillation in the Evening, EKG with diffuse ST elevation - pericarditis vs. ischemia  PLAN:  >cardiology-EP consulted, repeat Echo 7/27 - trivial pericardial effusion; Repeat EKG in one week (at [place] office).  If patient remains in atrial fibrillation, then to plan on cardioversion as outpatient.  >continue PTA metoprolol XL  >continue Eliquis 5 mg BID  >Sotalol 120 mg Q6H  >maintian K > 4 and Mg > 2  >continue Tylenol for post-procedure pain  ???  Hypertension/hypotension overnight  -stable blood pressures in 110-120/70-80  >PTA amlodipine 2.5 mg daily - hold with AKI, hypotension  >PTA losartan mg daily - hold with AKI, Hypotension  - remove Midodrine from [facility]   ???  CKDIII, now with AKI  -Cr 1.2  at baseline  - Cr. 1.5 today  - hold ARB, amlodipine; on NSAIds  - encourage po water intake -> repeat BMP in am  ???  BPH  >continue PTA finasteride   >continue PTA flomax  ???  Anxiety  >alprazolam 0.25 mg Q6H PRN  ???  Diet: Advance Diet as Tolerated  DIET CARDIAC(LOW FAT/LOW SODIUM)   ???  DVT PPX: eliquis  ??? Full Code  ???  Dispo: Continue inpatient admission for arrhythmia management .  ???  PCP:   [name], Melissa  Fax: [phone]    The above mentioned plan and the hospital course  was discussed in detail with the patient  Care coordination was discussed with case manager and social worker and the nurse  Medication side effects explained in detail  Imaging results discussed with the patient - rec outpatint follow up  Patient agrees and verbalizes understanding    SUBJECTIVE   Patient seen and examined. NAD    Overnight:   No interval events.    During this AM visit, patient states no new symptoms  ROS:  >>> Denies fever/chills            >>> Denies chest pain, SOB            >>> Denies  N/V/D            >>> Denies dizziness/lightheadedness       OBJECTIVE     VITAL SIGNS   BP: 130/89 (07/27 0800)  Temp: 36.3 ???C (97.3 ???F) (07/27 0800)  Pulse: 74 (07/27 0800)  Respirations: 16 PER MINUTE (  07/27 0800)  SpO2: 97 % (07/27 0800)  O2 Delivery: None (Room Air) (07/27 0800)  Height: 182.9 cm (72) (07/26 0905)       PHYSICAL EXAM   General: ???Alert and response to question appropriately. ???VS as above. No acute distress . ??????  Eyes: No conjunctival injection. ???Pupil equal on both size?????????No scleral icterus. ???  Ears, nose, mouth, and throat : No erythema. ??????MMM. ??????No erythema, exudate noted. ???  Neck: ???Symmetric appearance without crepitus, no obvious mass or noticeable swelling,   Lungs: ???No use of accessory muscles. ???Clear to auscultation bilaterally without wheezes, rales, or rhonchi.   Cardiovascular: Regular rate, S1, S2 normal, no murmurs, clicks, rubs, or gallops appreciated . ???2+ and symmetric, all extremities. ???No edema in BLE.   Abdomen: ???BS+, soft, ND, ???no guarding or rigidity. ???Nontender to palpation.  Skin: Skin color normal, no obvious evidence of rashes.   Musculoskeletal: ???Normal 5/5 ???hand-grip strength and ???distal strength in BLE. ???ROM normal. No joints swelling or erythema/tenderness Neurologic: ???Alerted and oriented x 3. ???5/5 hand-grip and distal strenght. Normal ROM. ???No Sensation grossly intact. ???No focal weakness. ???  Psych:??????Alerted and oriented, calm, normal affect    LAB REVIEW     Recent Labs      02/21/17   0310  02/22/17   0335   WBC  11.7*  8.2   HGB  13.5  12.8*   HCT  40.2  37.8*   PLTCT  242  178   MCV  91.3  90.9   INR  1.5*  1.6*     Recent Labs      02/21/17   0310  02/21/17   1120  02/22/17   0335   NA  133*  135*  136*   K  4.7  4.4  4.5   CL  103  103  104   CO2  26  26  25    BUN  29*  29*  37*   CR  1.51*  1.35*  1.54*   GAP  4  6  7    MG  2.6   --   2.3   GFR  46*  52*  45*       MEDICATIONS   Scheduled Meds:    apixaban (ELIQUIS) tablet 5 mg 5 mg Oral BID   aspirin EC tablet 81 mg 81 mg Oral QDAY   finasteride (PROSCAR) tablet 5 mg 5 mg Oral QDAY   indomethacin (INDOCIN) capsule 50 mg 50 mg Oral TID w/ meals   metoprolol XL (TOPROL XL) tablet 25 mg 25 mg Oral QDAY   sotalol (BETAPACE) tablet 120 mg 120 mg Oral Q10H*   tamsulosin (FLOMAX) capsule 0.4 mg 0.4 mg Oral QDAY   Continuous Infusions:  PRN and Respiratory Meds:acetaminophen Q4H PRN, ALPRAZolam Q6H PRN, aluminum/magnesium hydroxide Q4H PRN, docusate QDAY PRN, HYDROcodone/acetaminophen Q4H PRN, milk of magnesia (CONC) QHS PRN, nitroglycerin Q5 MIN PRN, ondansetron (ZOFRAN) IV Q6H PRN      RADIOLOGY AND OTHER DIAGNOSTIC PROCEDURES REVIEW   As in A/P  Radiology and Other Diagnostic Procedures Review:    02/22/2017        Pertinent radiology reviewed.

## 2017-02-23 ENCOUNTER — Inpatient Hospital Stay
Admit: 2017-02-18 | Discharge: 2017-02-23 | Disposition: A | Discharge status: Home / Self Care | Payer: MEDICARE | Admitting: Cardiovascular Disease

## 2017-02-23 ENCOUNTER — Inpatient Hospital Stay: Admit: 2017-02-21 | Discharge: 2017-02-21 | Payer: MEDICARE

## 2017-02-23 ENCOUNTER — Encounter: Admit: 2017-02-23 | Discharge: 2017-02-23 | Payer: MEDICARE

## 2017-02-23 ENCOUNTER — Encounter: Admit: 2017-02-18 | Discharge: 2017-02-18 | Payer: MEDICARE

## 2017-02-23 ENCOUNTER — Encounter: Admit: 2017-02-19 | Discharge: 2017-02-20 | Payer: MEDICARE

## 2017-02-23 DIAGNOSIS — I495 Sick sinus syndrome: ICD-10-CM

## 2017-02-23 DIAGNOSIS — Z79899 Other long term (current) drug therapy: ICD-10-CM

## 2017-02-23 DIAGNOSIS — N183 Chronic kidney disease, stage 3 (moderate): ICD-10-CM

## 2017-02-23 DIAGNOSIS — R079 Chest pain, unspecified: ICD-10-CM

## 2017-02-23 DIAGNOSIS — I10 Essential (primary) hypertension: ICD-10-CM

## 2017-02-23 DIAGNOSIS — N179 Acute kidney failure, unspecified: ICD-10-CM

## 2017-02-23 DIAGNOSIS — I481 Persistent atrial fibrillation: ICD-10-CM

## 2017-02-23 DIAGNOSIS — N400 Enlarged prostate without lower urinary tract symptoms: ICD-10-CM

## 2017-02-23 DIAGNOSIS — F419 Anxiety disorder, unspecified: ICD-10-CM

## 2017-02-23 DIAGNOSIS — I959 Hypotension, unspecified: ICD-10-CM

## 2017-02-23 DIAGNOSIS — Z7901 Long term (current) use of anticoagulants: ICD-10-CM

## 2017-02-23 DIAGNOSIS — I480 Paroxysmal atrial fibrillation: Principal | ICD-10-CM

## 2017-02-23 DIAGNOSIS — I483 Typical atrial flutter: ICD-10-CM

## 2017-02-23 LAB — URINALYSIS MICROSCOPIC REFLEX TO CULTURE

## 2017-02-23 LAB — URINALYSIS DIPSTICK REFLEX TO CULTURE
Lab: NEGATIVE
Lab: NEGATIVE
Lab: NEGATIVE
Lab: NEGATIVE
Lab: NEGATIVE
Lab: NEGATIVE
Lab: NEGATIVE

## 2017-02-23 LAB — CBC: Lab: 6.9 10*3/uL — ABNORMAL LOW (ref 4.5–11.0)

## 2017-02-23 LAB — BASIC METABOLIC PANEL: Lab: 134 MMOL/L — ABNORMAL LOW (ref 137–147)

## 2017-02-23 LAB — MAGNESIUM: Lab: 2 mg/dL — ABNORMAL LOW (ref >60)

## 2017-02-23 MED ORDER — SODIUM CHLORIDE 0.9 % IV SOLP
INTRAVENOUS | 0 refills | Status: DC
Start: 2017-02-23 — End: 2017-02-23
  Administered 2017-02-23: 14:00:00 1000.000 mL via INTRAVENOUS

## 2017-02-23 MED ORDER — SOTALOL 120 MG PO TAB
120 mg | ORAL_TABLET | Freq: Two times a day (BID) | ORAL | 3 refills | 30.00000 days | Status: AC
Start: 2017-02-23 — End: 2017-03-20
  Filled 2017-02-23 (×2): qty 60, 30d supply, fill #1

## 2017-02-23 NOTE — Progress Notes
Name:  Stanley Campbell                                               MRN:  [id number]   Admission Date:  02/18/2017  Today's Date: 02/23/17  LOS: 4      ASSESSMENT AND PLAN     76 y.o. male with PMH of atrial fibrillation, BPH, HTN, typical atrial flutter currently admitted for sotalol start.    Paroxysmal atrial fibrillation. 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  - 7/25 went back into Atrial fibrillation in the Evening, EKG with diffuse ST elevation - pericarditis vs. ischemia  PLAN:  >cardiology-EP consulted, repeat Echo 7/27 - trivial pericardial effusion; Repeat EKG in one week (at [place] office).  If patient remains in atrial fibrillation, then to plan on cardioversion as outpatient.  >continue PTA metoprolol XL  >continue Eliquis 5 mg BID  >Sotalol 120 mg Q6H  >maintian K > 4 and Mg > 2  >continue Tylenol for post-procedure pain  ???  Hypertension/hypotension overnight  -stable blood pressures in 110-120/70-80  >PTA amlodipine 2.5 mg daily - hold with AKI, hypotension  >PTA losartan mg daily - hold with AKI, Hypotension  - remove Midodrine from [facility]   ???  CKDIII, now with AKI  -Cr 1.2  at baseline  - Cr. 1.5 today  - hold ARB, amlodipine; on NSAIds  - encourage po water intake -> repeat BMP in am  ???  BPH  >continue PTA finasteride   >continue PTA flomax  ???  Anxiety  >alprazolam 0.25 mg Q6H PRN  ???  The above mentioned plan and the hospital course  was discussed in detail with the patient Care coordination was discussed with case manager and social worker and the nurse.  Imaging test (performed since current admission) results discussed with the patient - reccomended outpatint follow up with his/her primary care physician/subsecialists to address abnormal findings on non-emergent basis.  I also informed the patient about pending and abnormal test results and that she/he needs to contact her/his PCP/subsecialists after the discharge to further address them on non-emergent basis  PT/OT recommendations discussed with the patient  Consultants recommendations discussed with the patient  Went over patients discharge medications, no refills were required  Patient agrees and verbalizes understanding with above    Discharge instructions:  1. Verbal and written instructions were given and discussed with the patient.  2. Recommended follow up with primary care physician and/or consultants/subspecialists in 1-2 weeks .  3. Please return immediately to emergency room or call your primary care physician if you get worse, you do not get better or you develop any new or concerning symptoms.  4. Do not drive or operate machinary while taking opioids/pain killers.  5. I discussed the medications with the patient. I gave him/her signs and symptoms that could indicate an adverse reaction. I have advised her/him  to limit their activities until they can see how they respond to the medication.    Time spent on discharge 35 minutes    SUBJECTIVE   Patient seen and examined. NAD    Overnight:   No interval events.    During this AM visit, patient states no new symptoms  ROS:  >>> Denies fever/chills            >>> Denies chest pain, SOB            >>> Denies  N/V/D            >>> Denies dizziness/lightheadedness       OBJECTIVE     VITAL SIGNS   BP: 126/74 (07/28 0734)  Temp: 36.3 ???C (97.4 ???F) (07/28 0734)  Pulse: 54 (07/28 0734)  Respirations: 18 PER MINUTE (07/28 0734)  SpO2: 94 % (07/28 0734) O2 Delivery: None (Room Air) (07/28 0734)  Height: 182.9 cm (72) (07/27 1034)       PHYSICAL EXAM   General: ???Alert and response to question appropriately. ???VS as above. No acute distress . ??????  Eyes: No conjunctival injection. ???Pupil equal on both size?????????No scleral icterus. ???  Ears, nose, mouth, and throat : No erythema. ??????MMM. ??????No erythema, exudate noted. ???  Neck: ???Symmetric appearance without crepitus, no obvious mass or noticeable swelling,   Lungs: ???No use of accessory muscles. ???Clear to auscultation bilaterally without wheezes, rales, or rhonchi.   Cardiovascular: Regular rate, S1, S2 normal, no murmurs, clicks, rubs, or gallops appreciated . ???2+ and symmetric, all extremities. ???No edema in BLE.   Abdomen: ???BS+, soft, ND, ???no guarding or rigidity. ???Nontender to palpation.  Skin: Skin color normal, no obvious evidence of rashes.   Musculoskeletal: ???Normal 5/5 ???hand-grip strength and ???distal strength in BLE. ???ROM normal. No joints swelling or erythema/tenderness   Neurologic: ???Alerted and oriented x 3. ???5/5 hand-grip and distal strenght. Normal ROM. ???No Sensation grossly intact. ???No focal weakness. ???  Psych:??????Alerted and oriented, calm, normal affect    LAB REVIEW     Recent Labs      02/21/17   0310  02/22/17   0335  02/23/17   0425   WBC  11.7*  8.2  6.9   HGB  13.5  12.8*  12.7*   HCT  40.2  37.8*  38.3*   PLTCT  242  178  184   MCV  91.3  90.9  91.7   INR  1.5*  1.6*   --      Recent Labs      02/22/17   0335  02/23/17   0425   NA  136*  134*   K  4.5  4.3   CL  104  102   CO2  25  25   BUN  37*  46*   CR  1.54*  1.54*   GAP  7  7   MG  2.3  2.0   GFR  45*  45*       MEDICATIONS   Scheduled Meds:    apixaban (ELIQUIS) tablet 5 mg 5 mg Oral BID   aspirin EC tablet 81 mg 81 mg Oral QDAY   finasteride (PROSCAR) tablet 5 mg 5 mg Oral QDAY   indomethacin (INDOCIN) capsule 50 mg 50 mg Oral TID w/ meals   metoprolol XL (TOPROL XL) tablet 25 mg 25 mg Oral QDAY   sotalol (BETAPACE) tablet 120 mg 120 mg Oral BID tamsulosin (FLOMAX) capsule 0.4 mg  0.4 mg Oral QDAY   Continuous Infusions:  PRN and Respiratory Meds:acetaminophen Q4H PRN, ALPRAZolam Q6H PRN, aluminum/magnesium hydroxide Q4H PRN, docusate QDAY PRN, HYDROcodone/acetaminophen Q4H PRN, milk of magnesia (CONC) QHS PRN, nitroglycerin Q5 MIN PRN, ondansetron (ZOFRAN) IV Q6H PRN      RADIOLOGY AND OTHER DIAGNOSTIC PROCEDURES REVIEW   As in A/P  Radiology and Other Diagnostic Procedures Review:    02/23/2017        Pertinent radiology reviewed.

## 2017-02-23 NOTE — Progress Notes
Pt is discharged home this afternoon with family support.  Went over discharge instructions with pt and wife, verbalized understanding and denies any further questions.  Pt was provided written and verbal instruction on hoe to care for the PPM site at home. Pt was reminded to get lad order done on Monday.  Pt was wheeled to the main lobby by transport member escorted with his wife

## 2017-02-24 NOTE — Discharge Instructions - Pharmacy
Physician Discharge Summary      Name: Stanley Campbell  Medical Record Number: [id number]        Account Number:  [id number]  Date Of Birth:  13-Jan-1941                         Age:  76 years   Admit date:  02/18/2017                     Discharge date:  02/23/2017    Attending Physician:  Stanley Campbell               Service: Med Private N- [phone]    Physician Summary completed by: Stanley Hawthorn, MD    Reason for hospitalization: sotalol start.    Significant PMH:   Past Medical History:   Diagnosis Date   ??? A-fib (HCC) 08/18/2015   ??? BPH (benign prostatic hyperplasia) 08/18/2015   ??? Hypertension 08/18/2015   ??? 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 Typical atrial flutter (HCC)          Allergies: Patient has no known allergies.    Admission Physical Exam notable for:      General: Alert, cooperative, no distress, appears stated age   HENT: NC, AT, EOMI, PERRL, nares patent without polyps or drainage, throat clear, no exudates or ulcers  Neck: Supple, nontender, no JVD or lymphadenopathy  Resp: Clear to auscultation bilaterally   CV: Regular rate and rhythm, S1, S2 normal, no murmur.  No peripheral edema.  Left chest wall with PPM covered with bandage C/D/I.  GI: Soft, non-tender. Bowel sounds normal.  Skin: Skin color normal. No rashes or lesions.  Normal turgor.   MSK: No atrophy or abnormal strength or tone in the head, neck, spine, ribs, pelvis or extremities.   Left arm in sling.  Neuro: No focal deficits, CN 2-12 grossly intact bilaterally, oriented x3  Psych: normal affect and mood, remote and recent memory intact    Admission Lab/Radiology studies notable for: documented in notes    Brief Hospital Course:  The patient was admitted and the following issues were addressed during this hospitalization: (with pertinent details).      76 y.o. male with PMH of atrial fibrillation, BPH, HTN, typical atrial flutter currently admitted for sotalol start.  ??? Paroxysmal atrial fibrillation. 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  - 7/25 went back into Atrial fibrillation in the Evening, EKG with diffuse ST elevation - pericarditis vs. ischemia  PLAN:  >cardiology-EP consulted, repeat Echo 7/27 - trivial pericardial effusion; Repeat EKG in one week (at [place] office). ???If patient remains in atrial fibrillation, then to plan on cardioversion as outpatient.  >continue PTA metoprolol XL  >continue Eliquis 5 mg BID  >Sotalol 120 mg Q6H  >maintian K > 4 and Mg > 2  - dc plan d/w cardiology EP  ???  Hypertension/hypotension   -stable blood pressures  in 110-120/70-80  >PTA amlodipine 2.5 mg daily - hold with AKI, hypotension  >PTA losartan mg daily - hold with AKI, Hypotension  - [facility] from [facility]   ???  CKDIII, now with AKI  -Cr 1.2  at baseline  - Cr. 1.5 today  - hold ARB, amlodipine; was on NSAIds  ???  BPH  >continue PTA finasteride   >continue PTA flomax  ???  Anxiety  >alprazolam 0.25 mg Q6H PRN    Condition at Discharge: Stable    Discharge Diagnoses:      Hospital Problems        Active Problems    * (Principal)Typical atrial flutter (HCC)    Paroxysmal atrial fibrillation (HCC)    Hypertension    BPH (benign prostatic hyperplasia)    Anxiety    S/P ablation of atrial flutter    Tachy-brady syndrome (HCC)    Presence of cardiac pacemaker          Surgical Procedures: as mentioned above Significant Diagnostic Studies and Procedures: noted in brief hospital course    Consults:  Cardiology - EP    Patient Disposition: Home       Patient instructions/medications:     BASIC METABOLIC PANEL   Standing Status: Future  Standing Exp. Date: 02/18/18   Please fax results to: [phone]  Attn: Dr Barry Dienes     MAGNESIUM   Standing Status: Future  Standing Exp. Date: 02/23/18     Procedure Specific Activity   Do not raise your elbow higher than shoulder level on the affected side for the first 4 weeks. It is important to continue to move your shoulder joint gently each day to prevent stiffening or freezing of the joint.     You can typically resume driving 5 days after surgery. There may be special considerations if you passed out before your device was implanted.     If you are an active sleeper, you should use your sling at night until the restrictions on your affected arm are lifted.    No pushing, pulling, or lifting greater than 10 - 15 pounds for one month with the affected arm.    You should avoid bowling, swimming, golfing or swinging a baseball bat with the affected arm for 90 days.    Avoid any activity/exercise which involves rough contact with device site or might cause a heavy blow to the skin over the device.    You may participate in sexual activity in 1 week; however avoid placing all your weight on arms for 4 weeks.     You may return to work in 3-5 days unless instructed otherwise. This will vary with your occupation, age, and overall physical condition.     Stroke Information   F.A.S.T. is an easy way to remember the sudden signs of a stroke.    F - face drooping  A - arm weakness  S - speech difficulty  T - TIME TO CALL 911    If the person shows any of theses signs, even if the signs go away, call 9-1-1 IMMEDIATELY. Check the time so you will know when the first sign started.    Traits and lifestyle habits that increase your risk for stroke include prior stroke/TIA, age and gender. Continue your medications as ordered after discharge. These medications will help reduce your risk of another stroke.    Your most recent cholesterol levels, if taken during your stay, will display below. LDL is called bad cholesterol because it  can stick to your artery walls and block blood flow. Your LDL should be below 100. HDL is called the good cholesterol. Your HDL should be greater than 40.  Your lab results on No results found for requested labs within last 720 hours..    It is important that you follow-up with your primary care physician 4 to 6 weeks after discharge. Be sure to keep all follow-up appointments. Please bring your discharge instructions with you to your follow-up appointments.    Should you have any questions once you have returned home, please feel free to contact the Stroke Program Coordinator at [phone].    Your last blood pressure was BP: 173/86, Your target blood pressure is <130/80.    If you have diabetes, even if treated, you are at an increased risk of stroke.  Many people with diabetes also have high blood pressure, high cholesterol or are overweight.  This increases your risk even more.      Smoking doubles your risk of stroke.  Quitting can greatly reduce your risk.  For more information on smoking cessation call [phone] or visit [email or web address].     ECG 12-LEAD   Standing Status: Future  Standing Exp. Date: 02/23/18   Please fax result to your doctor.     Report These Signs and Symptoms   Please contact your doctor if you have any of the following symptoms: Chest pain, shortness of breath, lightheadedness, dizziness, near fainting, palpitations, abd pain, back pain, or bleeding.     Questions About Your Stay   For questions or concerns regarding your hospital stay:    - DURING BUSINESS HOURS (8:00 AM - 4:30 PM):    Call [phone] and asked to be transferred to your discharge attending physician. - AFTER BUSINESS HOURS (4:30 PM - 8:00 AM, on weekends, or holidays):  Call [phone] and ask the operator to page the on-call doctor for the discharge attending physician.   Discharging attending physician: Stanley Campbell [[id number]]      Low Fat / Low Cholesterol Diet   Your goal is to limit the amount of saturated and trans fats in your diet. Keep track of how much cholesterol you eat and limit the total amount to 200mg  (milligrams) a day.      If you have questions about your diet after you go home, you can call a dietitian at [phone].       Incision Care   You may shower after 2-3 days, however try to keep your incision clean and DRY until dressing removed at follow up incision check appointment. Take sponge baths working around the incision during this time or if showering, cover with a towel or dressing with your incision facing away from the shower head.    You may shower once dressing removed at follow up appointment; however avoid direct contact with the incision (allow the water to hit the back of your shoulder rather than directly on the incision).    Do not submerge incision in tub, pool, hot tub, or lake for 4 weeks.    Unless your incision is bleeding or draining, keep it open to air.    Avoid applying deodorants, powders, creams, lotions, etc. to your incision for 4 weeks.    Usually there are no stitches to be removed. Steri-strips will begin to fall off in 10-14 days. If they remain after 2 weeks, gently remove them when they are damp after a shower.    Your incision should gradually look better each  day. Please notify our office immediately if you notice any of the following:   -an increase in swelling or redness   -any drainage   -increasing pain at the incision site  -fever over 100 degrees  -NO sitting in any water for 1 week.     Return Appointment   Please call [facility] Scheduling at [phone], Monday through Friday, between 8 a.m. to 5 p.m. to make your follow up hospitalization appointment for 9month.   [place] Provider Vanice Sarah [phone]      Request for Cardiology Appointment   Standing Status: Future  Standing Exp. Date: 02/18/22   Scheduling Priority: Routine    Schedule OV with (1st choice Provider) Danella Maiers M.D.    Location of Appointment [place] / Tues-Thurs         Current Discharge Medication List       START taking these medications    Details   sotalol (BETAPACE) 120 mg tab Take 1 tablet by mouth twice daily.  Qty: 60 tablet, Refills: 3    PRESCRIPTION TYPE:  Normal          CONTINUE these medications which have NOT CHANGED    Details   apixaban (ELIQUIS) 5 mg tablet Take 1 tablet by mouth twice daily.  Qty: 60 tablet, Refills: 11    PRESCRIPTION TYPE:  Normal      aspirin EC 81 mg tablet Take 1 Tab by mouth daily. Take with food.  Qty: 90 Tab, Refills: 3    PRESCRIPTION TYPE:  No Print      docusate (COLACE) 100 mg capsule Take 100 mg by mouth at bedtime daily.    PRESCRIPTION TYPE:  Historical Med      finasteride (PROSCAR) 5 mg tablet Take 5 mg by mouth daily.    PRESCRIPTION TYPE:  Historical Med      metoprolol XL (TOPROL XL) 25 mg extended release tablet Take 25 mg by mouth daily.    PRESCRIPTION TYPE:  Historical Med      tamsulosin (FLOMAX) 0.4 mg capsule Take 0.4 mg by mouth daily. Do not crush, chew or open capsules. Take 30 minutes following the same meal each day.    PRESCRIPTION TYPE:  Historical Med          The following medications were removed from your list. This list includes medications discontinued this stay and those removed from your prior med list in our system        ALPRAZolam (XANAX) 0.25 mg tablet        amLODIPine (NORVASC) 2.5 mg tablet        dofetilide (TIKOSYN) 500 mcg capsule        losartan(+) (COZAAR) 100 mg tablet        midodrine (PROAMATINE) 5 mg tablet               Scheduled appointments:    Feb 26, 2017  1:00 PM CDT Nurse Visit with [facility]  [facility] at [facility]) [address]  [address]  [name] [address]  [phone]   May 21, 2017 10:30 AM CDT  Looping Recorder Check with [facility] PACEMAKER  [facility] ([facility] [place] [address]  [address]  [place] [address]  [phone]   May 21, 2017 11:00 AM CDT  Return Patient with Smith Robert, MD  [facility]) [employer] [facility]  [address]  [address]  [place] [address]  [phone]    Additional appointment instructions:      Follow up with PCP in 1-2 weeks.  Follow up with cardiology.  Follow up PPM site check on 02/26/17 @ [place] clinic                     Pending items needing follow up: none    Signed:  Royal Hawthorn, MD  02/23/2017      cc:  Primary Care Physician:  Rockwell Germany   Verified  Referring physicians:  Vanice Sarah, MD   Additional provider(s):

## 2017-02-25 LAB — BASIC METABOLIC PANEL
Lab: 1.3 — ABNORMAL HIGH (ref 0.72–1.25)
Lab: 10 — ABNORMAL HIGH (ref 8.8–10.0)
Lab: 103
Lab: 106 mL/min — ABNORMAL LOW (ref >60)
Lab: 14
Lab: 140 mg/dL (ref 0.4–1.00)
Lab: 25
Lab: 26 mL/min (ref >60)
Lab: 5.5 mg/dL — ABNORMAL HIGH (ref 3.5–5.1)
Lab: 54
Lab: 62

## 2017-02-25 LAB — MAGNESIUM: Lab: 2.5 MMOL/L — ABNORMAL LOW (ref 21–30)

## 2017-02-26 ENCOUNTER — Encounter: Admit: 2017-02-26 | Discharge: 2017-02-26 | Payer: MEDICARE

## 2017-02-26 DIAGNOSIS — I4891 Unspecified atrial fibrillation: Principal | ICD-10-CM

## 2017-02-26 NOTE — Progress Notes
Removed Silverlon dressing. (Left) prepectoral incision is clean, dry, well approximated, and healing without evidence of drainage or discharge. Steri-Strips dry and intact. Pt reports no adverse symptoms. Incision care, including signs and symptoms of infection, reviewed(as below). Pt verbalized understanding and will remain in phone contact.    You may shower once dressing removed at follow up appointment; however avoid direct contact with the incision (allow the water to hit the back of your shoulder rather than directly on the incision).    Do not submerge incision in tub, pool, hot tub, or lake for 4 weeks.    Unless your incision is bleeding or draining, keep it open to air.    Avoid applying deodorants, powders, creams, lotions, etc. to your incision for 4 weeks.    Usually there are no stitches to be removed. Steri-strips will begin to fall off in 10-14 days. If they remain after 2 weeks, gently remove them when they are damp after a shower.    Your incision should gradually look better each day. Please notify our office immediately if you notice any of the following:   -an increase in swelling or redness   -any drainage   -increasing pain at the incision site  -fever over 100 degrees

## 2017-02-26 NOTE — Telephone Encounter
Patient call and LM stating that he was having some skin cut off his nose and then having plastic surgery for skin cancer. He asked if he should be taking antibiotics.    Patient's cardiology history includes PAF, HTN, atrial flutter ablation, pacemaker implanted, and tachy-brady syndrome. No history noted of prosthetic heart valve, previous endocarditis, congenital heart diseases, or heart transplant. Dr. Ricard Dillon saw patient for OV 02/05/17 and no mention of patient needing antibiotics prior to procedures.    I called patient and he confirmed that he does not have an artifical heart valve and has not had an infection in his heart previously. He verbalized understanding that per a cardiology standpoint he did not need antibiotics for BE prophylaxis.    He stated that he can continue on his Eliquis for the procedure, but he is needing to hold Aspirin 81 mg for one week prior to procedure on 03/14/17. I stated I would notify Dr. Ricard Dillon and call back should Dr. Ricard Dillon have concerns for holding Aspirin for one week. Patient verbalized understanding.

## 2017-02-28 NOTE — Telephone Encounter
I received below recommendations. I called the patient and spoke with [name], pt's spouse, and she stated that she would notify the patient that he can hold his Aspirin for 7 days prior to procedure. She stated that they did not need him to hold his Eliquis. She stated that patient is having the procedure done with Dr. Rich Reining office in [place].    I called Dr. Rich Reining office, [phone], and spoke with staff. She stated that the patient did not need to hold Eliquis for the procedure.    Medication Question (Antibiotics for upcoming procedures. 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 Patient having a skin biopsy and plastic surgery for skin cancer. He asked if he should be taking antibiotics. History includes PAF, HTN, atrial flutter ablation, pacemaker implanted, and tachy-brady syndrome. Pt verbalized understanding per a cardiology standpoint he did not need antibiotics for BE prophylaxis. He can continue on Eliquis for the procedure, but he is needing to hold Aspirin 81 mg for one week prior to procedure on 03/14/17. Is patient OK to hold Aspirin for one week? Thank you for your review. (Routing comment)

## 2017-03-05 ENCOUNTER — Ambulatory Visit: Admit: 2017-03-05 | Discharge: 2017-03-06 | Payer: MEDICARE

## 2017-03-05 ENCOUNTER — Encounter: Admit: 2017-03-05 | Discharge: 2017-03-05 | Payer: MEDICARE

## 2017-03-05 DIAGNOSIS — Z959 Presence of cardiac and vascular implant and graft, unspecified: Principal | ICD-10-CM

## 2017-03-06 DIAGNOSIS — Z959 Presence of cardiac and vascular implant and graft, unspecified: Principal | ICD-10-CM

## 2017-03-13 ENCOUNTER — Encounter: Admit: 2017-03-13 | Discharge: 2017-03-13 | Payer: MEDICARE

## 2017-03-13 DIAGNOSIS — R931 Abnormal findings on diagnostic imaging of heart and coronary circulation: ICD-10-CM

## 2017-03-13 DIAGNOSIS — I420 Dilated cardiomyopathy: Principal | ICD-10-CM

## 2017-03-13 MED ORDER — LIDOCAINE (PF) 10 MG/ML (1 %) IJ SOLN
.1-2 mL | INTRAMUSCULAR | 0 refills | Status: CN | PRN
Start: 2017-03-13 — End: ?

## 2017-03-13 MED ORDER — CEFAZOLIN INJ 1GM IVP
2 g | Freq: Once | INTRAVENOUS | 0 refills | Status: CN
Start: 2017-03-13 — End: ?

## 2017-03-13 MED ORDER — SODIUM CHLORIDE 0.9 % IV SOLP
INTRAVENOUS | 0 refills | Status: CN
Start: 2017-03-13 — End: ?

## 2017-03-13 MED ORDER — CEFAZOLIN IVPB
1 g | INTRAVENOUS | 0 refills | Status: CN
Start: 2017-03-13 — End: ?

## 2017-03-13 NOTE — Progress Notes
scheduled EP procedure for 9/6 on WRONG patient.   Change form sent to EP fax

## 2017-03-15 ENCOUNTER — Encounter: Admit: 2017-03-15 | Discharge: 2017-03-15 | Payer: MEDICARE

## 2017-03-15 DIAGNOSIS — N400 Enlarged prostate without lower urinary tract symptoms: ICD-10-CM

## 2017-03-15 DIAGNOSIS — I495 Sick sinus syndrome: ICD-10-CM

## 2017-03-15 DIAGNOSIS — I10 Essential (primary) hypertension: ICD-10-CM

## 2017-03-15 DIAGNOSIS — I4891 Unspecified atrial fibrillation: Principal | ICD-10-CM

## 2017-03-15 DIAGNOSIS — Z950 Presence of cardiac pacemaker: ICD-10-CM

## 2017-03-15 DIAGNOSIS — I483 Typical atrial flutter: ICD-10-CM

## 2017-03-15 NOTE — Telephone Encounter
-----   Message from Lucilla Edin sent at 03/15/2017  1:04 PM CDT -----  Regarding: MPE-Surgery Monday need eliquis instructions   Sonia Baller with Dr.Pena office calling to get eliquis instructions for surgery on Monday.  Call back is [phone]

## 2017-03-15 NOTE — Telephone Encounter
did not receive the form however, called clinic and let Sonia Baller know at surgeon's office  received  EKG which was reviewed by Dr Larina Bras.  paced rhythm with pac's.          ----- Message from Lucilla Edin sent at 03/15/2017  1:04 PM CDT -----  Regarding: MPE-Surgery Monday need eliquis instructions   Sonia Baller with Dr.Pena office calling to get eliquis instructions for surgery on Monday.  Call back is [phone]

## 2017-03-15 NOTE — Telephone Encounter
-----   Message from Toney Reil, RN sent at 03/14/2017  4:51 PM CDT -----  Regarding: FW: faxing card clearane from [place] for plastic surgery      ----- Message -----  From: Lubertha Basque, RN  Sent: 03/14/2017  To: Suzanne Boron Nurse Ep  Subject: faxing card clearane from [place] [facility] [place] for plasti#    Dr Larina Bras rec'd a call from a plastic surgeon who is doing a procedure tomorrow or the next couple of days (dr e wasn't sure).  no sign of fax at this time.  Dr Larina Bras said that patient is not pacemaker dependant  on eliquis - Dr E advised them to do an EKG if SR no bridge needed when stopping eliquis.   If it is atrial fibrillation he needs bridge.  I was hoping form would clarify a little more....  his recent remotes have shown an increase in AF and there has been discussion about cardioversion.  that's all I know. don't hesitate to page mpe if questions, he is in the lab thursday.  thanks

## 2017-03-20 ENCOUNTER — Encounter: Admit: 2017-03-20 | Discharge: 2017-03-20 | Payer: MEDICARE

## 2017-03-20 MED ORDER — SOTALOL 120 MG PO TAB
120 mg | ORAL_TABLET | Freq: Two times a day (BID) | ORAL | 3 refills | 30.00000 days | Status: AC
Start: 2017-03-20 — End: 2017-05-21

## 2017-03-21 ENCOUNTER — Encounter: Admit: 2017-03-21 | Discharge: 2017-03-21 | Payer: MEDICARE

## 2017-03-21 DIAGNOSIS — I480 Paroxysmal atrial fibrillation: Principal | ICD-10-CM

## 2017-04-04 ENCOUNTER — Ambulatory Visit: Admit: 2017-04-04 | Discharge: 2017-04-05 | Payer: MEDICARE

## 2017-04-04 ENCOUNTER — Encounter: Admit: 2017-04-04 | Discharge: 2017-04-04 | Payer: MEDICARE

## 2017-04-04 DIAGNOSIS — Z950 Presence of cardiac pacemaker: ICD-10-CM

## 2017-04-04 DIAGNOSIS — I480 Paroxysmal atrial fibrillation: ICD-10-CM

## 2017-04-04 DIAGNOSIS — N400 Enlarged prostate without lower urinary tract symptoms: ICD-10-CM

## 2017-04-04 DIAGNOSIS — I495 Sick sinus syndrome: ICD-10-CM

## 2017-04-04 DIAGNOSIS — I483 Typical atrial flutter: ICD-10-CM

## 2017-04-04 DIAGNOSIS — I10 Essential (primary) hypertension: ICD-10-CM

## 2017-04-04 DIAGNOSIS — I4891 Unspecified atrial fibrillation: Principal | ICD-10-CM

## 2017-04-04 NOTE — Progress Notes
Date of Service: 04/04/2017    Stanley Campbell is a 76 y.o. male.       HPI     Stanley Campbell was in the [place] office with his wife today.  He has had a lot of problems with atrial arrhythmias and during a hospitalization at [facility] in July he was started on sotalol and a permanent pacemaker was implanted.    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  This restriction lasts for another 6 weeks, a total of 3 months post implant.    On the other hand he does not think he has had much in the way of atrial arrhythmias.  We interrogated his pacemaker today and his A. fib burden is right about 5%.    He is tolerating sotalol and has not had any particular trouble with palpitations.  He has had no dizziness or syncope.    He has not had any chest pain or breathlessness.         Vitals:    04/04/17 0855 04/04/17 0906   BP: (!) 140/92 (!) 152/98   Pulse: 65    Weight: 88.1 kg (194 lb 3.2 oz)    Height: 1.829 m (6')      Body mass index is 26.34 kg/m???.     Past Medical History  Patient Active Problem List    Diagnosis Date Noted   ??? 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 S/P ablation of atrial flutter 02/18/2017   ??? Typical atrial flutter (HCC) 02/04/2017     Added automatically from request for surgery [id number]     ??? 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 Paroxysmal atrial fibrillation (HCC) 08/18/2015     ~2005 - Isolated episode of PAF  07/2015 - AF episode while in [facility] ED for hypertension  09/11/16 - Rapid AF during treadmill MPI at [facility] study     ??? Hypertension 08/18/2015   ??? BPH (benign prostatic hyperplasia) 08/18/2015   ??? Anxiety 08/18/2015         Review of Systems   Constitution: Negative.   HENT: Positive for hearing loss.    Eyes: Negative.    Cardiovascular: Negative.    Respiratory: Positive for cough. Endocrine: Positive for polyuria.   Hematologic/Lymphatic: Bruises/bleeds easily.   Skin: Positive for skin cancer.   Musculoskeletal: Negative.    Gastrointestinal: Negative.    Genitourinary: Negative.    Neurological: Negative.    Psychiatric/Behavioral: Negative.    Allergic/Immunologic: Negative.        Physical Exam    Physical Exam   General Appearance: no distress   Skin: warm, no ulcers or xanthomas   Digits and Nails: no cyanosis or clubbing   Eyes: conjunctivae and lids normal, pupils are equal and round   Teeth/Gums/Palate: dentition unremarkable, no lesions   Lips & Oral Mucosa: no pallor or cyanosis   Neck Veins: normal JVP , neck veins are not distended   Thyroid: no nodules, masses, tenderness or enlargement   Chest Inspection: chest is normal in appearance   Respiratory Effort: breathing comfortably, no respiratory distress   Auscultation/Percussion: lungs clear to auscultation, no rales or rhonchi, no wheezing   PMI: PMI not enlarged or displaced   Cardiac Rhythm: regular rhythm and normal rate   Cardiac Auscultation: S1, S2 normal,  no rub, no gallop   Murmurs: no murmur   Peripheral Circulation: normal peripheral circulation   Carotid Arteries: normal carotid upstroke bilaterally, no bruits   Radial Arteries: normal symmetric radial pulses   Abdominal Aorta: no abdominal aortic bruit   Pedal Pulses: normal symmetric pedal pulses   Lower Extremity Edema: no lower extremity edema   Abdominal Exam: soft, non-tender, no masses, bowel sounds normal   Liver & Spleen: no organomegaly   Gait & Station: walks without assistance   Muscle Strength: normal muscle tone   Orientation: oriented to time, place and person   Affect & Mood: appropriate and sustained affect   Language and Memory: patient responsive and seems to comprehend information   Neurologic Exam: neurological assessment grossly intact   Other: moves all extremities      Cardiovascular Studies EKG:  Atrial-paced rhythm.  Incomplete RBBB and LAFB.  QT 400 msec.    Problems Addressed Today  Encounter Diagnoses   Name Primary?   ??? Presence of cardiac pacemaker    ??? Paroxysmal atrial fibrillation (HCC)    ??? Essential hypertension        Assessment and Plan       Presence of cardiac pacemaker  The incision looks fine and he is still on an activity restriction that was set up when he left the hospital post implant.    Paroxysmal atrial fibrillation (HCC)  Although he is thought to have a left sided focus for some of his residual atrial arrhythmias I would think that if we can keep his burden to 5% on sotalol we would simply continue medical therapy and not resort any further ablation procedures.    Hypertension  He always cracks his blood pressure up at the doctor's office but his blood pressure at home has been in the range of 130/80.  He used to take losartan and amlodipine and at this point I am not going to restart either of those because I think his blood pressure is probably about where it should be for him.      Current Medications (including today's revisions)  ??? apixaban (ELIQUIS) 5 mg tablet Take 1 tablet by mouth twice daily.   ??? aspirin EC 81 mg tablet Take 1 Tab by mouth daily. Take with food.   ??? docusate (COLACE) 100 mg capsule Take 100 mg by mouth at bedtime daily.   ??? finasteride (PROSCAR) 5 mg tablet Take 5 mg by mouth daily.   ??? metoprolol XL (TOPROL XL) 25 mg extended release tablet Take 25 mg by mouth daily.   ??? sotalol (BETAPACE) 120 mg tab Take one tablet by mouth twice daily.   ??? tamsulosin (FLOMAX) 0.4 mg capsule Take 0.4 mg by mouth daily. Do not crush, chew or open capsules. Take 30 minutes following the same meal each day.

## 2017-04-04 NOTE — Assessment & Plan Note
He always cracks his blood pressure up at the doctor's office but his blood pressure at home has been in the range of 130/80.  He used to take losartan and amlodipine and at this point I am not going to restart either of those because I think his blood pressure is probably about where it should be for him.

## 2017-04-04 NOTE — Assessment & Plan Note
Although he is thought to have a left sided focus for some of his residual atrial arrhythmias I would think that if we can keep his burden to 5% on sotalol we would simply continue medical therapy and not resort any further ablation procedures.

## 2017-04-04 NOTE — Assessment & Plan Note
The incision looks fine and he is still on an activity restriction that was set up when he left the hospital post implant.

## 2017-04-22 LAB — LIPID PROFILE
Lab: 12
Lab: 122 — ABNORMAL HIGH (ref <100)
Lab: 208 — ABNORMAL HIGH (ref 150–200)
Lab: 3
Lab: 61
Lab: 76 — ABNORMAL HIGH (ref 35–60)

## 2017-05-16 ENCOUNTER — Encounter: Admit: 2017-05-16 | Discharge: 2017-05-16 | Payer: MEDICARE

## 2017-05-16 DIAGNOSIS — Z950 Presence of cardiac pacemaker: Principal | ICD-10-CM

## 2017-05-21 ENCOUNTER — Ambulatory Visit: Admit: 2017-05-21 | Discharge: 2017-05-21 | Payer: MEDICARE

## 2017-05-21 ENCOUNTER — Encounter: Admit: 2017-05-21 | Discharge: 2017-05-21 | Payer: MEDICARE

## 2017-05-21 DIAGNOSIS — I4891 Unspecified atrial fibrillation: Principal | ICD-10-CM

## 2017-05-21 DIAGNOSIS — Z950 Presence of cardiac pacemaker: Principal | ICD-10-CM

## 2017-05-21 DIAGNOSIS — I495 Sick sinus syndrome: ICD-10-CM

## 2017-05-21 DIAGNOSIS — I480 Paroxysmal atrial fibrillation: ICD-10-CM

## 2017-05-21 DIAGNOSIS — I10 Essential (primary) hypertension: ICD-10-CM

## 2017-05-21 DIAGNOSIS — I483 Typical atrial flutter: ICD-10-CM

## 2017-05-21 DIAGNOSIS — N400 Enlarged prostate without lower urinary tract symptoms: ICD-10-CM

## 2017-05-21 MED ORDER — SOTALOL 120 MG PO TAB
120 mg | ORAL_TABLET | Freq: Two times a day (BID) | ORAL | 1 refills | 30.00000 days | Status: AC
Start: 2017-05-21 — End: 2017-05-21

## 2017-05-21 MED ORDER — SOTALOL 120 MG PO TAB
120 mg | ORAL_TABLET | Freq: Two times a day (BID) | ORAL | 3 refills | 30.00000 days | Status: AC
Start: 2017-05-21 — End: 2017-07-26

## 2017-05-21 MED ORDER — METOPROLOL SUCCINATE 25 MG PO TB24
25 mg | ORAL_TABLET | Freq: Two times a day (BID) | ORAL | 3 refills | 90.00000 days | Status: AC
Start: 2017-05-21 — End: 2017-05-22

## 2017-05-22 ENCOUNTER — Encounter: Admit: 2017-05-22 | Discharge: 2017-05-22 | Payer: MEDICARE

## 2017-05-22 LAB — COMPREHENSIVE METABOLIC PANEL
Lab: 0.9
Lab: 1.1
Lab: 10 — ABNORMAL HIGH (ref 8.8–10.0)
Lab: 100
Lab: 106 (ref 80–99)
Lab: 4.1
Lab: 4.6 — ABNORMAL HIGH (ref 42–52)
Lab: 7.9
Lab: 80
Lab: 141
Lab: 17 — ABNORMAL LOW (ref 33–37)
Lab: 26

## 2017-05-22 LAB — CBC
Lab: 5.8
Lab: 9.3

## 2017-05-22 NOTE — Telephone Encounter
-----   Message from Louis Meckel, LPN sent at 88/91/6945  9:21 AM CDT -----  Regarding: MPE- f/u b/p  VM on triage line from wife [name].  She is calling with b/p readings due to elevated yesterday at [name].  B/p 91/67, HR- 68 and just now 97/64, HR- 80.  Call back at # [phone].

## 2017-05-22 NOTE — Telephone Encounter
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 BP 91/67 and 97/64  she states he was lightheaded and felt weak this am.   states it usually goes up through the day but is not as high as it was yesterday.  relayed will review with Dr Larina Bras for further advise

## 2017-05-23 ENCOUNTER — Ambulatory Visit: Admit: 2017-05-23 | Discharge: 2017-05-24 | Payer: MEDICARE

## 2017-05-23 ENCOUNTER — Encounter: Admit: 2017-05-23 | Discharge: 2017-05-23 | Payer: MEDICARE

## 2017-05-23 DIAGNOSIS — Z950 Presence of cardiac pacemaker: Principal | ICD-10-CM

## 2017-05-24 DIAGNOSIS — Z950 Presence of cardiac pacemaker: Principal | ICD-10-CM

## 2017-07-26 ENCOUNTER — Encounter: Admit: 2017-07-26 | Discharge: 2017-07-26 | Payer: MEDICARE

## 2017-07-26 MED ORDER — SOTALOL 120 MG PO TAB
120 mg | ORAL_TABLET | Freq: Two times a day (BID) | ORAL | 0 refills | 30.00000 days | Status: AC
Start: 2017-07-26 — End: 2018-02-11

## 2017-07-26 MED ORDER — APIXABAN 5 MG PO TAB
5 mg | ORAL_TABLET | Freq: Two times a day (BID) | ORAL | 0 refills | Status: AC
Start: 2017-07-26 — End: ?

## 2017-07-26 NOTE — Telephone Encounter
VM from wife # [phone], on triage line.   Michela Pitcher that they are in [place] and he does not have enough medication to get him home.   He needs Eliquis and Sotalol called into [facility] on [address]   # [phone] and could we do 7 days.   They are leaving tomorrow to head home.

## 2017-08-23 ENCOUNTER — Ambulatory Visit: Admit: 2017-08-23 | Discharge: 2017-08-24 | Payer: MEDICARE

## 2017-08-24 DIAGNOSIS — Z950 Presence of cardiac pacemaker: ICD-10-CM

## 2017-08-24 DIAGNOSIS — I495 Sick sinus syndrome: Principal | ICD-10-CM

## 2017-08-28 ENCOUNTER — Encounter: Admit: 2017-08-28 | Discharge: 2017-08-28 | Payer: MEDICARE

## 2017-08-28 ENCOUNTER — Ambulatory Visit: Admit: 2017-08-28 | Discharge: 2017-08-28 | Payer: MEDICARE

## 2017-08-28 DIAGNOSIS — I483 Typical atrial flutter: ICD-10-CM

## 2017-08-28 DIAGNOSIS — I480 Paroxysmal atrial fibrillation: Principal | ICD-10-CM

## 2017-08-28 DIAGNOSIS — I10 Essential (primary) hypertension: ICD-10-CM

## 2017-08-28 DIAGNOSIS — I495 Sick sinus syndrome: ICD-10-CM

## 2017-08-28 DIAGNOSIS — Z950 Presence of cardiac pacemaker: ICD-10-CM

## 2017-08-28 DIAGNOSIS — N400 Enlarged prostate without lower urinary tract symptoms: ICD-10-CM

## 2017-08-28 DIAGNOSIS — I4891 Unspecified atrial fibrillation: Principal | ICD-10-CM

## 2017-08-28 MED ORDER — LISINOPRIL 10 MG PO TAB
10 mg | ORAL_TABLET | Freq: Every evening | ORAL | 3 refills | Status: AC
Start: 2017-08-28 — End: 2017-11-04

## 2017-09-04 ENCOUNTER — Encounter: Admit: 2017-09-04 | Discharge: 2017-09-04 | Payer: MEDICARE

## 2017-09-04 DIAGNOSIS — I480 Paroxysmal atrial fibrillation: Principal | ICD-10-CM

## 2017-09-04 LAB — BASIC METABOLIC PANEL
Lab: 10 — ABNORMAL HIGH (ref 8.8–10.0)
Lab: 142
Lab: 22 — ABNORMAL LOW
Lab: 5.1
Lab: 58 — ABNORMAL LOW (ref >59)
Lab: 96 — ABNORMAL HIGH
Lab: 1.2 — ABNORMAL HIGH (ref 0.72–1.25)
Lab: 105 — ABNORMAL LOW
Lab: 14 uU/mL — ABNORMAL HIGH (ref 0.35–5.00)
Lab: 28

## 2017-09-09 ENCOUNTER — Encounter: Admit: 2017-09-09 | Discharge: 2017-09-09 | Payer: MEDICARE

## 2017-09-11 ENCOUNTER — Encounter: Admit: 2017-09-11 | Discharge: 2017-09-11 | Payer: MEDICARE

## 2017-09-11 ENCOUNTER — Ambulatory Visit: Admit: 2017-09-11 | Discharge: 2017-09-12 | Payer: MEDICARE

## 2017-09-11 ENCOUNTER — Ambulatory Visit: Admit: 2017-09-11 | Discharge: 2017-09-11 | Payer: MEDICARE

## 2017-09-11 DIAGNOSIS — I480 Paroxysmal atrial fibrillation: Principal | ICD-10-CM

## 2017-09-11 MED ORDER — IOPAMIDOL 76 % IV SOLN
80 mL | Freq: Once | INTRAVENOUS | 0 refills | Status: CP
Start: 2017-09-11 — End: ?
  Administered 2017-09-11: 17:00:00 80 mL via INTRAVENOUS

## 2017-09-11 MED ORDER — SODIUM CHLORIDE 0.9 % IJ SOLN
100 mL | Freq: Once | INTRAVENOUS | 0 refills | Status: CP
Start: 2017-09-11 — End: ?
  Administered 2017-09-11: 17:00:00 100 mL via INTRAVENOUS

## 2017-09-17 ENCOUNTER — Encounter: Admit: 2017-09-17 | Discharge: 2017-09-17 | Payer: MEDICARE

## 2017-09-17 DIAGNOSIS — I480 Paroxysmal atrial fibrillation: Principal | ICD-10-CM

## 2017-09-17 DIAGNOSIS — Z01812 Encounter for preprocedural laboratory examination: Principal | ICD-10-CM

## 2017-09-17 MED ORDER — PANTOPRAZOLE 40 MG PO TBEC
40 mg | ORAL_TABLET | Freq: Two times a day (BID) | ORAL | 0 refills | 90.00000 days | Status: AC
Start: 2017-09-17 — End: 2017-09-24

## 2017-09-17 MED ORDER — LIDOCAINE (PF) 10 MG/ML (1 %) IJ SOLN
.1-2 mL | INTRAMUSCULAR | 0 refills | Status: CN | PRN
Start: 2017-09-17 — End: ?

## 2017-09-17 MED ORDER — LIDOCAINE HCL 2 % MM JELP
Freq: Once | TOPICAL | 0 refills | Status: CN
Start: 2017-09-17 — End: ?

## 2017-09-17 MED ORDER — SODIUM CHLORIDE 0.9 % IV SOLP
INTRAVENOUS | 0 refills | Status: CN
Start: 2017-09-17 — End: ?

## 2017-09-18 DIAGNOSIS — I4891 Unspecified atrial fibrillation: ICD-10-CM

## 2017-09-24 ENCOUNTER — Ambulatory Visit: Admit: 2017-09-24 | Discharge: 2017-09-24 | Payer: MEDICARE

## 2017-09-24 ENCOUNTER — Ambulatory Visit: Admit: 2017-09-24 | Discharge: 2017-09-25 | Payer: MEDICARE

## 2017-09-24 ENCOUNTER — Encounter: Admit: 2017-09-24 | Discharge: 2017-09-24 | Payer: MEDICARE

## 2017-09-24 DIAGNOSIS — I10 Essential (primary) hypertension: ICD-10-CM

## 2017-09-24 DIAGNOSIS — I483 Typical atrial flutter: ICD-10-CM

## 2017-09-24 DIAGNOSIS — I4891 Unspecified atrial fibrillation: Principal | ICD-10-CM

## 2017-09-24 DIAGNOSIS — I495 Sick sinus syndrome: ICD-10-CM

## 2017-09-24 DIAGNOSIS — N400 Enlarged prostate without lower urinary tract symptoms: ICD-10-CM

## 2017-09-24 DIAGNOSIS — Z950 Presence of cardiac pacemaker: ICD-10-CM

## 2017-09-24 MED ORDER — PANTOPRAZOLE 40 MG PO TBEC
40 mg | ORAL_TABLET | Freq: Two times a day (BID) | ORAL | 0 refills | 90.00000 days | Status: AC
Start: 2017-09-24 — End: ?

## 2017-09-25 ENCOUNTER — Encounter: Admit: 2017-09-25 | Discharge: 2017-09-25 | Payer: MEDICARE

## 2017-09-25 DIAGNOSIS — I480 Paroxysmal atrial fibrillation: Principal | ICD-10-CM

## 2017-10-01 ENCOUNTER — Encounter: Admit: 2017-10-01 | Discharge: 2017-10-01 | Payer: MEDICARE

## 2017-10-01 DIAGNOSIS — L03039 Cellulitis of unspecified toe: Principal | ICD-10-CM

## 2017-10-02 ENCOUNTER — Encounter: Admit: 2017-10-02 | Discharge: 2017-10-02 | Payer: MEDICARE

## 2017-10-04 ENCOUNTER — Ambulatory Visit: Admit: 2017-10-04 | Discharge: 2017-10-05 | Payer: MEDICARE

## 2017-10-04 ENCOUNTER — Encounter: Admit: 2017-10-04 | Discharge: 2017-10-04 | Payer: MEDICARE

## 2017-10-04 ENCOUNTER — Inpatient Hospital Stay: Admit: 2017-10-04 | Discharge: 2017-10-04 | Payer: MEDICARE

## 2017-10-04 DIAGNOSIS — H9190 Unspecified hearing loss, unspecified ear: ICD-10-CM

## 2017-10-04 DIAGNOSIS — C4491 Basal cell carcinoma of skin, unspecified: ICD-10-CM

## 2017-10-04 DIAGNOSIS — I483 Typical atrial flutter: ICD-10-CM

## 2017-10-04 DIAGNOSIS — C801 Malignant (primary) neoplasm, unspecified: ICD-10-CM

## 2017-10-04 DIAGNOSIS — I4891 Unspecified atrial fibrillation: Principal | ICD-10-CM

## 2017-10-04 DIAGNOSIS — F419 Anxiety disorder, unspecified: ICD-10-CM

## 2017-10-04 DIAGNOSIS — Z950 Presence of cardiac pacemaker: ICD-10-CM

## 2017-10-04 DIAGNOSIS — I495 Sick sinus syndrome: ICD-10-CM

## 2017-10-04 DIAGNOSIS — I10 Essential (primary) hypertension: ICD-10-CM

## 2017-10-04 DIAGNOSIS — N400 Enlarged prostate without lower urinary tract symptoms: ICD-10-CM

## 2017-10-04 DIAGNOSIS — Z01812 Encounter for preprocedural laboratory examination: Secondary | ICD-10-CM

## 2017-10-04 LAB — BASIC METABOLIC PANEL
Lab: 1.3 mg/dL — ABNORMAL HIGH (ref 0.4–1.24)
Lab: 10 mg/dL (ref >60)
Lab: 107 MMOL/L (ref 98–110)
Lab: 140 MMOL/L — ABNORMAL LOW (ref >60)
Lab: 23 mg/dL — ABNORMAL LOW (ref 7–25)
Lab: 26 MMOL/L (ref 21–30)
Lab: 4.7 MMOL/L — ABNORMAL LOW (ref 3.5–5.1)
Lab: 51 mL/min — ABNORMAL LOW (ref >60)
Lab: 7 FL (ref 3–12)
Lab: 99 mg/dL (ref 70–100)
Lab: >60 mL/min (ref >60)

## 2017-10-04 LAB — CBC AND DIFF
Lab: 0 10*3/uL (ref 0–0.20)
Lab: 0.2 10*3/uL (ref 0–0.45)
Lab: 0.6 10*3/uL (ref 0–0.80)
Lab: 1 % (ref 0–2)
Lab: 1.1 10*3/uL (ref 1.0–4.8)
Lab: 13 % (ref 11–15)
Lab: 16 % — ABNORMAL LOW (ref 24–44)
Lab: 16 g/dL (ref 13.5–16.5)
Lab: 223 10*3/uL (ref 150–400)
Lab: 31 pg (ref 26–34)
Lab: 34 g/dL (ref 32.0–36.0)
Lab: 48 % (ref 40–50)
Lab: 5.2 M/UL (ref 4.4–5.5)
Lab: 5.4 10*3/uL (ref 1.8–7.0)
Lab: 7.3 10*3/uL (ref 4.5–11.0)
Lab: 73 % (ref 41–77)
Lab: 8 % (ref 4–12)
Lab: 8.2 FL (ref 7–11)
Lab: 91 FL (ref 80–100)

## 2017-10-04 LAB — MAGNESIUM: Lab: 2.4 mg/dL (ref 1.6–2.6)

## 2017-10-04 MED ORDER — ALPRAZOLAM 0.5 MG PO TAB
.5 mg | ORAL_TABLET | Freq: Two times a day (BID) | ORAL | 2 refills | Status: AC | PRN
Start: 2017-10-04 — End: 2018-08-05

## 2017-10-05 DIAGNOSIS — I4891 Unspecified atrial fibrillation: ICD-10-CM

## 2017-10-05 DIAGNOSIS — I480 Paroxysmal atrial fibrillation: ICD-10-CM

## 2017-10-05 DIAGNOSIS — Z01810 Encounter for preprocedural cardiovascular examination: Principal | ICD-10-CM

## 2017-10-09 ENCOUNTER — Encounter: Admit: 2017-10-09 | Discharge: 2017-10-09 | Payer: MEDICARE

## 2017-10-09 LAB — CBC AND DIFF
Lab: 0.1
Lab: 0.1
Lab: 0.5
Lab: 0.9
Lab: 1.3
Lab: 12
Lab: 15 K/UL (ref 0–0.80)
Lab: 19
Lab: 2.2
Lab: 240
Lab: 29
Lab: 31 — ABNORMAL LOW (ref 33.0–37.0)
Lab: 4.7
Lab: 49 10*3/uL (ref 0–0.45)
Lab: 5.3 10*3/uL (ref 1.0–4.8)
Lab: 6.7 K/UL (ref 1.8–7.0)
Lab: 69
Lab: 7.6
Lab: 92 10*3/uL (ref 0–0.20)

## 2017-10-09 MED ORDER — POTASSIUM CHLORIDE 20 MEQ PO TBTQ
40-60 meq | ORAL | 0 refills | Status: CN | PRN
Start: 2017-10-09 — End: ?

## 2017-10-09 MED ORDER — TEMAZEPAM 15 MG PO CAP
15 mg | Freq: Every evening | ORAL | 0 refills | Status: CN | PRN
Start: 2017-10-09 — End: ?

## 2017-10-09 MED ORDER — DOCUSATE SODIUM 100 MG PO CAP
100 mg | Freq: Every day | ORAL | 0 refills | Status: CN | PRN
Start: 2017-10-09 — End: ?

## 2017-10-09 MED ORDER — POTASSIUM CHLORIDE 20 MEQ/15 ML PO LIQD
40-60 meq | NASOGASTRIC | 0 refills | Status: CN | PRN
Start: 2017-10-09 — End: ?

## 2017-10-09 MED ORDER — ACETAMINOPHEN 325 MG PO TAB
650 mg | ORAL | 0 refills | Status: CN | PRN
Start: 2017-10-09 — End: ?

## 2017-10-09 MED ORDER — MAGNESIUM SULFATE IN D5W 1 GRAM/100 ML IV PGBK
1 g | INTRAVENOUS | 0 refills | Status: CN | PRN
Start: 2017-10-09 — End: ?

## 2017-10-09 MED ORDER — ALUMINUM-MAGNESIUM HYDROXIDE 200-200 MG/5 ML PO SUSP
30 mL | ORAL | 0 refills | Status: CN | PRN
Start: 2017-10-09 — End: ?

## 2017-10-10 ENCOUNTER — Encounter: Admit: 2017-10-10 | Discharge: 2017-10-10 | Payer: MEDICARE

## 2017-10-10 DIAGNOSIS — L03039 Cellulitis of unspecified toe: Principal | ICD-10-CM

## 2017-10-11 ENCOUNTER — Encounter: Admit: 2017-10-11 | Discharge: 2017-10-11 | Payer: MEDICARE

## 2017-10-11 ENCOUNTER — Inpatient Hospital Stay: Admit: 2017-10-11 | Discharge: 2017-10-12 | Payer: MEDICARE

## 2017-10-11 ENCOUNTER — Inpatient Hospital Stay: Admit: 2017-10-11 | Discharge: 2017-10-11 | Payer: MEDICARE

## 2017-10-11 ENCOUNTER — Ambulatory Visit: Admit: 2017-10-11 | Discharge: 2017-10-11 | Payer: MEDICARE

## 2017-10-11 ENCOUNTER — Encounter: Admit: 2017-10-11 | Discharge: 2017-10-12 | Payer: MEDICARE

## 2017-10-11 DIAGNOSIS — N400 Enlarged prostate without lower urinary tract symptoms: ICD-10-CM

## 2017-10-11 DIAGNOSIS — I483 Typical atrial flutter: ICD-10-CM

## 2017-10-11 DIAGNOSIS — C4491 Basal cell carcinoma of skin, unspecified: ICD-10-CM

## 2017-10-11 DIAGNOSIS — H9190 Unspecified hearing loss, unspecified ear: ICD-10-CM

## 2017-10-11 DIAGNOSIS — C801 Malignant (primary) neoplasm, unspecified: ICD-10-CM

## 2017-10-11 DIAGNOSIS — I480 Paroxysmal atrial fibrillation: Principal | ICD-10-CM

## 2017-10-11 DIAGNOSIS — I495 Sick sinus syndrome: ICD-10-CM

## 2017-10-11 DIAGNOSIS — Z950 Presence of cardiac pacemaker: ICD-10-CM

## 2017-10-11 DIAGNOSIS — I4891 Unspecified atrial fibrillation: Principal | ICD-10-CM

## 2017-10-11 DIAGNOSIS — F419 Anxiety disorder, unspecified: ICD-10-CM

## 2017-10-11 DIAGNOSIS — I10 Essential (primary) hypertension: ICD-10-CM

## 2017-10-11 LAB — POC ACTIVATED CLOTTING TIME
Lab: 363 s
Lab: 366 s
Lab: 391 s — ABNORMAL LOW (ref 21–30)
Lab: 395 s

## 2017-10-11 LAB — BASIC METABOLIC PANEL
Lab: 1.2 mg/dL (ref 0.4–1.24)
Lab: 107 MMOL/L (ref 98–110)
Lab: 115 mg/dL — ABNORMAL HIGH (ref 70–100)
Lab: 141 MMOL/L (ref 137–147)
Lab: 20 mg/dL (ref 7–25)
Lab: 27 MMOL/L (ref 21–30)
Lab: 58 mL/min — ABNORMAL LOW (ref >60)
Lab: 7 (ref 3–12)
Lab: 9.8 mg/dL (ref 8.5–10.6)
Lab: >60 mL/min (ref >60)

## 2017-10-11 LAB — POC PT/INR: Lab: 1.1 g/dL — ABNORMAL LOW (ref 0.8–1.2)

## 2017-10-11 LAB — MAGNESIUM: Lab: 2.5 mg/dL (ref 1.6–2.6)

## 2017-10-11 MED ORDER — HYDROMORPHONE (PF) 2 MG/ML IJ SYRG
.5-1 mg | INTRAVENOUS | 0 refills | Status: DC | PRN
Start: 2017-10-11 — End: 2017-10-12

## 2017-10-11 MED ORDER — PHENYLEPHRINE IV DRIP (STD CONC)
0 refills | Status: DC
Start: 2017-10-11 — End: 2017-10-11

## 2017-10-11 MED ORDER — ROCURONIUM 10 MG/ML IV SOLN
INTRAVENOUS | 0 refills | Status: DC
Start: 2017-10-11 — End: 2017-10-11

## 2017-10-11 MED ORDER — FENTANYL CITRATE (PF) 50 MCG/ML IJ SOLN
0 refills | Status: DC
Start: 2017-10-11 — End: 2017-10-11

## 2017-10-11 MED ORDER — SUCRALFATE 1 GRAM PO TAB
1 g | Freq: Two times a day (BID) | ORAL | 0 refills | Status: DC
Start: 2017-10-11 — End: 2017-10-12
  Administered 2017-10-11 – 2017-10-12 (×3): 1 g via ORAL

## 2017-10-11 MED ORDER — PROPOFOL INJ 10 MG/ML IV VIAL
0 refills | Status: DC
Start: 2017-10-11 — End: 2017-10-11

## 2017-10-11 MED ORDER — MAGNESIUM SULFATE IN D5W 1 GRAM/100 ML IV PGBK
1 g | INTRAVENOUS | 0 refills | Status: DC | PRN
Start: 2017-10-11 — End: 2017-10-12

## 2017-10-11 MED ORDER — TAMSULOSIN 0.4 MG PO CAP
.4 mg | Freq: Every day | ORAL | 0 refills | Status: DC
Start: 2017-10-11 — End: 2017-10-12
  Administered 2017-10-11 – 2017-10-12 (×2): 0.4 mg via ORAL

## 2017-10-11 MED ORDER — ALUMINUM-MAGNESIUM HYDROXIDE 200-200 MG/5 ML PO SUSP
30 mL | ORAL | 0 refills | Status: DC | PRN
Start: 2017-10-11 — End: 2017-10-12

## 2017-10-11 MED ORDER — SOTALOL 120 MG PO TAB
120 mg | Freq: Two times a day (BID) | ORAL | 0 refills | Status: DC
Start: 2017-10-11 — End: 2017-10-12
  Administered 2017-10-12 (×2): 120 mg via ORAL

## 2017-10-11 MED ORDER — FENTANYL CITRATE (PF) 50 MCG/ML IJ SOLN
25-75 ug | INTRAVENOUS | 0 refills | Status: DC | PRN
Start: 2017-10-11 — End: 2017-10-12

## 2017-10-11 MED ORDER — PROTAMINE IVPB
20 mg | INTRAVENOUS | 0 refills | Status: AC | PRN
Start: 2017-10-11 — End: ?

## 2017-10-11 MED ORDER — ELECTROLYTE-A IV SOLP
0 refills | Status: DC
Start: 2017-10-11 — End: 2017-10-11

## 2017-10-11 MED ORDER — PHENYLEPHRINE IN 0.9% NACL(PF) 1 MG/10 ML (100 MCG/ML) IV SYRG
INTRAVENOUS | 0 refills | Status: DC
Start: 2017-10-11 — End: 2017-10-11

## 2017-10-11 MED ORDER — PANTOPRAZOLE 40 MG PO TBEC
40 mg | Freq: Two times a day (BID) | ORAL | 0 refills | Status: DC
Start: 2017-10-11 — End: 2017-10-12
  Administered 2017-10-11 – 2017-10-12 (×3): 40 mg via ORAL

## 2017-10-11 MED ORDER — ASPIRIN 81 MG PO TBEC
81 mg | Freq: Every day | ORAL | 0 refills | Status: DC
Start: 2017-10-11 — End: 2017-10-12
  Administered 2017-10-12: 13:00:00 81 mg via ORAL

## 2017-10-11 MED ORDER — HALOPERIDOL LACTATE 5 MG/ML IJ SOLN
1 mg | Freq: Once | INTRAVENOUS | 0 refills | Status: AC | PRN
Start: 2017-10-11 — End: ?

## 2017-10-11 MED ORDER — MORPHINE 4 MG/ML IV CRTG
2 mg | Freq: Once | INTRAVENOUS | 0 refills | Status: AC | PRN
Start: 2017-10-11 — End: ?

## 2017-10-11 MED ORDER — PROTAMINE IVPB
40 mg | INTRAVENOUS | 0 refills | Status: AC | PRN
Start: 2017-10-11 — End: ?
  Administered 2017-10-11 (×2): 40 mg via INTRAVENOUS

## 2017-10-11 MED ORDER — LIDOCAINE (PF) 200 MG/10 ML (2 %) IJ SYRG
0 refills | Status: DC
Start: 2017-10-11 — End: 2017-10-11

## 2017-10-11 MED ORDER — CEFAZOLIN 1 GRAM IJ SOLR
0 refills | Status: DC
Start: 2017-10-11 — End: 2017-10-11

## 2017-10-11 MED ORDER — HYDROCODONE-ACETAMINOPHEN 5-325 MG PO TAB
1 | ORAL | 0 refills | Status: DC | PRN
Start: 2017-10-11 — End: 2017-10-12

## 2017-10-11 MED ORDER — FENTANYL CITRATE (PF) 50 MCG/ML IJ SOLN
25-50 ug | INTRAVENOUS | 0 refills | Status: DC | PRN
Start: 2017-10-11 — End: 2017-10-12

## 2017-10-11 MED ORDER — ACETAMINOPHEN 325 MG PO TAB
650 mg | ORAL | 0 refills | Status: DC | PRN
Start: 2017-10-11 — End: 2017-10-11

## 2017-10-11 MED ORDER — SUCRALFATE 1 GRAM PO TAB
1 g | ORAL_TABLET | Freq: Two times a day (BID) | ORAL | 0 refills | Status: CN
Start: 2017-10-11 — End: ?

## 2017-10-11 MED ORDER — DEXAMETHASONE SODIUM PHOSPHATE 4 MG/ML IJ SOLN
INTRAVENOUS | 0 refills | Status: DC
Start: 2017-10-11 — End: 2017-10-11

## 2017-10-11 MED ORDER — DIPHENHYDRAMINE HCL 50 MG/ML IJ SOLN
25 mg | Freq: Once | INTRAVENOUS | 0 refills | Status: AC | PRN
Start: 2017-10-11 — End: ?

## 2017-10-11 MED ORDER — VASOPRESSIN 20 UNIT/ML IV SOLN
0 refills | Status: DC
Start: 2017-10-11 — End: 2017-10-11

## 2017-10-11 MED ORDER — ACETAMINOPHEN 325 MG PO TAB
325-650 mg | ORAL | 0 refills | Status: DC | PRN
Start: 2017-10-11 — End: 2017-10-12

## 2017-10-11 MED ORDER — POTASSIUM CHLORIDE 20 MEQ PO TBTQ
40-60 meq | ORAL | 0 refills | Status: DC | PRN
Start: 2017-10-11 — End: 2017-10-12

## 2017-10-11 MED ORDER — SODIUM CHLORIDE 0.9 % IV SOLP
INTRAVENOUS | 0 refills | Status: DC
Start: 2017-10-11 — End: 2017-10-12
  Administered 2017-10-11: 12:00:00 1000.000 mL via INTRAVENOUS

## 2017-10-11 MED ORDER — PROMETHAZINE 25 MG/ML IJ SOLN
6.25 mg | INTRAVENOUS | 0 refills | Status: DC | PRN
Start: 2017-10-11 — End: 2017-10-12

## 2017-10-11 MED ORDER — DEXTRAN 70-HYPROMELLOSE (PF) 0.1-0.3 % OP DPET
0 refills | Status: DC
Start: 2017-10-11 — End: 2017-10-11

## 2017-10-11 MED ORDER — LACTATED RINGERS IV SOLP
INTRAVENOUS | 0 refills | Status: DC
Start: 2017-10-11 — End: 2017-10-12

## 2017-10-11 MED ORDER — FINASTERIDE 5 MG PO TAB
5 mg | Freq: Every day | ORAL | 0 refills | Status: DC
Start: 2017-10-11 — End: 2017-10-12
  Administered 2017-10-11: 21:00:00 5 mg via ORAL

## 2017-10-11 MED ORDER — TEMAZEPAM 15 MG PO CAP
15 mg | Freq: Every evening | ORAL | 0 refills | Status: DC | PRN
Start: 2017-10-11 — End: 2017-10-12

## 2017-10-11 MED ORDER — OXYCODONE 5 MG PO TAB
5-10 mg | Freq: Once | ORAL | 0 refills | Status: AC | PRN
Start: 2017-10-11 — End: ?

## 2017-10-11 MED ORDER — ALPRAZOLAM 0.5 MG PO TAB
.5 mg | Freq: Two times a day (BID) | ORAL | 0 refills | Status: DC | PRN
Start: 2017-10-11 — End: 2017-10-12
  Administered 2017-10-12: 05:00:00 0.5 mg via ORAL

## 2017-10-11 MED ORDER — ONDANSETRON HCL (PF) 4 MG/2 ML IJ SOLN
4 mg | INTRAVENOUS | 0 refills | Status: DC | PRN
Start: 2017-10-11 — End: 2017-10-12

## 2017-10-11 MED ORDER — LIDOCAINE (PF) 10 MG/ML (1 %) IJ SOLN
.1-2 mL | INTRAMUSCULAR | 0 refills | Status: DC | PRN
Start: 2017-10-11 — End: 2017-10-12

## 2017-10-11 MED ORDER — APIXABAN 5 MG PO TAB
5 mg | Freq: Two times a day (BID) | ORAL | 0 refills | Status: DC
Start: 2017-10-11 — End: 2017-10-12
  Administered 2017-10-11 – 2017-10-12 (×2): 5 mg via ORAL

## 2017-10-11 MED ORDER — METOPROLOL SUCCINATE 25 MG PO TB24
25 mg | Freq: Two times a day (BID) | ORAL | 0 refills | Status: DC
Start: 2017-10-11 — End: 2017-10-12
  Administered 2017-10-12 (×2): 25 mg via ORAL

## 2017-10-11 MED ORDER — SODIUM CHLORIDE 0.9 % IV SOLP
INTRAVENOUS | 0 refills | Status: DC
Start: 2017-10-11 — End: 2017-10-12
  Administered 2017-10-11: 18:00:00 1000 mL via INTRAVENOUS

## 2017-10-11 MED ORDER — POTASSIUM CHLORIDE 20 MEQ/15 ML PO LIQD
40-60 meq | NASOGASTRIC | 0 refills | Status: DC | PRN
Start: 2017-10-11 — End: 2017-10-12

## 2017-10-11 MED ORDER — DOCUSATE SODIUM 100 MG PO CAP
100 mg | Freq: Every day | ORAL | 0 refills | Status: DC | PRN
Start: 2017-10-11 — End: 2017-10-12

## 2017-10-11 MED ORDER — DOCUSATE SODIUM 100 MG PO CAP
100 mg | Freq: Every evening | ORAL | 0 refills | Status: DC
Start: 2017-10-11 — End: 2017-10-12

## 2017-10-11 MED ORDER — LIDOCAINE HCL 2 % MM JELP
Freq: Once | TOPICAL | 0 refills | Status: AC
Start: 2017-10-11 — End: ?

## 2017-10-12 DIAGNOSIS — I480 Paroxysmal atrial fibrillation: Principal | ICD-10-CM

## 2017-10-12 LAB — COMPREHENSIVE METABOLIC PANEL
Lab: 13 U/L (ref 7–56)
Lab: 137 MMOL/L (ref 137–147)
Lab: 26 MMOL/L (ref 21–30)
Lab: 3.3 g/dL — ABNORMAL LOW (ref 3.5–5.0)
Lab: 4 (ref 3–12)
Lab: 4.2 MMOL/L (ref 3.5–5.1)
Lab: 44 U/L (ref 25–110)
Lab: 48 U/L — ABNORMAL HIGH (ref 7–40)
Lab: >60 mL/min (ref >60)
Lab: >60 mL/min (ref >60)

## 2017-10-12 LAB — CBC
Lab: 10 10*3/uL (ref 4.5–11.0)
Lab: 13 % (ref 11–15)
Lab: 161 10*3/uL (ref 150–400)
Lab: 31 pg (ref 26–34)
Lab: 34 g/dL (ref 32.0–36.0)
Lab: 37 % — ABNORMAL LOW (ref 40–50)
Lab: 4 M/UL — ABNORMAL LOW (ref 4.4–5.5)
Lab: 8.4 FL (ref 7–11)
Lab: 91 FL (ref 80–100)

## 2017-10-12 LAB — POC ACTIVATED CLOTTING TIME
Lab: 160 s
Lab: 301 s (ref 3–12)

## 2017-10-12 LAB — MAGNESIUM: Lab: 2 mg/dL — ABNORMAL LOW (ref 1.6–2.6)

## 2017-10-12 MED ORDER — SUCRALFATE 1 GRAM PO TAB
1 g | ORAL_TABLET | Freq: Two times a day (BID) | ORAL | 0 refills | Status: AC
Start: 2017-10-12 — End: 2018-06-18

## 2017-10-14 ENCOUNTER — Encounter: Admit: 2017-10-14 | Discharge: 2017-10-14 | Payer: MEDICARE

## 2017-10-14 DIAGNOSIS — Z950 Presence of cardiac pacemaker: ICD-10-CM

## 2017-10-14 DIAGNOSIS — I495 Sick sinus syndrome: ICD-10-CM

## 2017-10-14 DIAGNOSIS — C4491 Basal cell carcinoma of skin, unspecified: ICD-10-CM

## 2017-10-14 DIAGNOSIS — I483 Typical atrial flutter: ICD-10-CM

## 2017-10-14 DIAGNOSIS — N400 Enlarged prostate without lower urinary tract symptoms: ICD-10-CM

## 2017-10-14 DIAGNOSIS — I10 Essential (primary) hypertension: ICD-10-CM

## 2017-10-14 DIAGNOSIS — C801 Malignant (primary) neoplasm, unspecified: ICD-10-CM

## 2017-10-14 DIAGNOSIS — I4891 Unspecified atrial fibrillation: Principal | ICD-10-CM

## 2017-10-14 DIAGNOSIS — H9190 Unspecified hearing loss, unspecified ear: ICD-10-CM

## 2017-10-14 DIAGNOSIS — F419 Anxiety disorder, unspecified: ICD-10-CM

## 2017-10-18 ENCOUNTER — Encounter: Admit: 2017-10-18 | Discharge: 2017-10-18 | Payer: MEDICARE

## 2017-10-18 ENCOUNTER — Ambulatory Visit: Admit: 2017-10-18 | Discharge: 2017-10-19 | Payer: MEDICARE

## 2017-10-31 ENCOUNTER — Encounter: Admit: 2017-10-31 | Discharge: 2017-10-31 | Payer: MEDICARE

## 2017-10-31 DIAGNOSIS — I10 Essential (primary) hypertension: ICD-10-CM

## 2017-10-31 DIAGNOSIS — I4891 Unspecified atrial fibrillation: Principal | ICD-10-CM

## 2017-10-31 DIAGNOSIS — Z950 Presence of cardiac pacemaker: ICD-10-CM

## 2017-10-31 DIAGNOSIS — C4491 Basal cell carcinoma of skin, unspecified: ICD-10-CM

## 2017-10-31 DIAGNOSIS — I483 Typical atrial flutter: ICD-10-CM

## 2017-10-31 DIAGNOSIS — I495 Sick sinus syndrome: ICD-10-CM

## 2017-10-31 DIAGNOSIS — H9190 Unspecified hearing loss, unspecified ear: ICD-10-CM

## 2017-10-31 DIAGNOSIS — C801 Malignant (primary) neoplasm, unspecified: ICD-10-CM

## 2017-10-31 DIAGNOSIS — N400 Enlarged prostate without lower urinary tract symptoms: ICD-10-CM

## 2017-10-31 DIAGNOSIS — F419 Anxiety disorder, unspecified: ICD-10-CM

## 2017-11-04 ENCOUNTER — Ambulatory Visit: Admit: 2017-11-04 | Discharge: 2017-11-04 | Payer: MEDICARE

## 2017-11-04 ENCOUNTER — Encounter: Admit: 2017-11-04 | Discharge: 2017-11-04 | Payer: MEDICARE

## 2017-11-04 DIAGNOSIS — I495 Sick sinus syndrome: ICD-10-CM

## 2017-11-04 DIAGNOSIS — I480 Paroxysmal atrial fibrillation: Principal | ICD-10-CM

## 2017-11-04 DIAGNOSIS — I483 Typical atrial flutter: ICD-10-CM

## 2017-11-04 DIAGNOSIS — N400 Enlarged prostate without lower urinary tract symptoms: ICD-10-CM

## 2017-11-04 DIAGNOSIS — H9190 Unspecified hearing loss, unspecified ear: ICD-10-CM

## 2017-11-04 DIAGNOSIS — Z950 Presence of cardiac pacemaker: ICD-10-CM

## 2017-11-04 DIAGNOSIS — I4891 Unspecified atrial fibrillation: Principal | ICD-10-CM

## 2017-11-04 DIAGNOSIS — F419 Anxiety disorder, unspecified: ICD-10-CM

## 2017-11-04 DIAGNOSIS — C4491 Basal cell carcinoma of skin, unspecified: ICD-10-CM

## 2017-11-04 DIAGNOSIS — C801 Malignant (primary) neoplasm, unspecified: ICD-10-CM

## 2017-11-04 DIAGNOSIS — I10 Essential (primary) hypertension: ICD-10-CM

## 2017-11-04 MED ORDER — LISINOPRIL 5 MG PO TAB
5 mg | ORAL_TABLET | Freq: Every day | ORAL | 3 refills | Status: AC
Start: 2017-11-04 — End: 2017-12-05

## 2017-11-05 ENCOUNTER — Encounter: Admit: 2017-11-05 | Discharge: 2017-11-05 | Payer: MEDICARE

## 2017-11-19 ENCOUNTER — Encounter: Admit: 2017-11-19 | Discharge: 2017-11-19 | Payer: MEDICARE

## 2017-11-22 ENCOUNTER — Ambulatory Visit: Admit: 2017-11-22 | Discharge: 2017-11-23 | Payer: MEDICARE

## 2017-11-23 DIAGNOSIS — Z950 Presence of cardiac pacemaker: Principal | ICD-10-CM

## 2017-12-05 ENCOUNTER — Ambulatory Visit: Admit: 2017-12-05 | Discharge: 2017-12-06 | Payer: MEDICARE

## 2017-12-05 ENCOUNTER — Encounter: Admit: 2017-12-05 | Discharge: 2017-12-05 | Payer: MEDICARE

## 2017-12-05 DIAGNOSIS — F419 Anxiety disorder, unspecified: ICD-10-CM

## 2017-12-05 DIAGNOSIS — I10 Essential (primary) hypertension: ICD-10-CM

## 2017-12-05 DIAGNOSIS — I495 Sick sinus syndrome: ICD-10-CM

## 2017-12-05 DIAGNOSIS — I483 Typical atrial flutter: ICD-10-CM

## 2017-12-05 DIAGNOSIS — Z950 Presence of cardiac pacemaker: ICD-10-CM

## 2017-12-05 DIAGNOSIS — C4491 Basal cell carcinoma of skin, unspecified: ICD-10-CM

## 2017-12-05 DIAGNOSIS — I4891 Unspecified atrial fibrillation: Principal | ICD-10-CM

## 2017-12-05 DIAGNOSIS — H9190 Unspecified hearing loss, unspecified ear: ICD-10-CM

## 2017-12-05 DIAGNOSIS — C801 Malignant (primary) neoplasm, unspecified: ICD-10-CM

## 2017-12-05 DIAGNOSIS — I480 Paroxysmal atrial fibrillation: Principal | ICD-10-CM

## 2017-12-05 DIAGNOSIS — N400 Enlarged prostate without lower urinary tract symptoms: ICD-10-CM

## 2017-12-12 ENCOUNTER — Encounter: Admit: 2017-12-12 | Discharge: 2017-12-12 | Payer: MEDICARE

## 2017-12-26 ENCOUNTER — Encounter: Admit: 2017-12-26 | Discharge: 2017-12-26 | Payer: MEDICARE

## 2017-12-26 DIAGNOSIS — I480 Paroxysmal atrial fibrillation: Principal | ICD-10-CM

## 2017-12-30 ENCOUNTER — Encounter: Admit: 2017-12-30 | Discharge: 2017-12-30 | Payer: MEDICARE

## 2017-12-30 DIAGNOSIS — I480 Paroxysmal atrial fibrillation: Principal | ICD-10-CM

## 2018-01-07 ENCOUNTER — Encounter: Admit: 2018-01-07 | Discharge: 2018-01-07 | Payer: MEDICARE

## 2018-01-07 DIAGNOSIS — E875 Hyperkalemia: Principal | ICD-10-CM

## 2018-01-07 DIAGNOSIS — I480 Paroxysmal atrial fibrillation: Principal | ICD-10-CM

## 2018-01-07 LAB — BASIC METABOLIC PANEL
Lab: 1.2
Lab: 104
Lab: 138
Lab: 60
Lab: 98
Lab: 10 — ABNORMAL HIGH (ref 8.8–10.0)
Lab: 25
Lab: 27
Lab: 5.4 — ABNORMAL HIGH (ref 3.5–5.1)

## 2018-01-08 ENCOUNTER — Encounter: Admit: 2018-01-08 | Discharge: 2018-01-08 | Payer: MEDICARE

## 2018-01-14 ENCOUNTER — Ambulatory Visit: Admit: 2018-01-14 | Discharge: 2018-01-14 | Payer: MEDICARE

## 2018-01-14 ENCOUNTER — Encounter: Admit: 2018-01-14 | Discharge: 2018-01-14 | Payer: MEDICARE

## 2018-01-14 ENCOUNTER — Ambulatory Visit: Admit: 2018-01-14 | Discharge: 2018-01-15 | Payer: MEDICARE

## 2018-01-14 DIAGNOSIS — E875 Hyperkalemia: ICD-10-CM

## 2018-01-14 DIAGNOSIS — I480 Paroxysmal atrial fibrillation: Principal | ICD-10-CM

## 2018-01-14 LAB — BASIC METABOLIC PANEL
Lab: 1.2 mg/dL (ref 0.4–1.24)
Lab: 104 MMOL/L (ref 98–110)
Lab: 136 MMOL/L — ABNORMAL LOW (ref 137–147)
Lab: 22 mg/dL — ABNORMAL LOW (ref >60)
Lab: 25 MMOL/L (ref 21–30)
Lab: 4.2 MMOL/L (ref 3.5–5.1)
Lab: 59 mL/min — ABNORMAL LOW (ref >60)
Lab: 7 10*3/uL — ABNORMAL HIGH (ref 3–12)
Lab: 9.8 mg/dL (ref 8.5–10.6)
Lab: 98 mg/dL — ABNORMAL LOW (ref >60)
Lab: >60 mL/min (ref >60)

## 2018-01-14 MED ORDER — SODIUM CHLORIDE 0.9 % IJ SOLN
50 mL | Freq: Once | INTRAVENOUS | 0 refills | Status: CP
Start: 2018-01-14 — End: ?

## 2018-01-14 MED ORDER — IOHEXOL 350 MG IODINE/ML IV SOLN
80 mL | Freq: Once | INTRAVENOUS | 0 refills | Status: CP
Start: 2018-01-14 — End: ?
  Administered 2018-01-14: 17:00:00 80 mL via INTRAVENOUS

## 2018-01-22 ENCOUNTER — Encounter: Admit: 2018-01-22 | Discharge: 2018-01-22 | Payer: MEDICARE

## 2018-02-11 ENCOUNTER — Ambulatory Visit: Admit: 2018-02-11 | Discharge: 2018-02-11 | Payer: MEDICARE

## 2018-02-11 ENCOUNTER — Encounter: Admit: 2018-02-11 | Discharge: 2018-02-11 | Payer: MEDICARE

## 2018-02-11 DIAGNOSIS — Z8679 Personal history of other diseases of the circulatory system: ICD-10-CM

## 2018-02-11 DIAGNOSIS — Z950 Presence of cardiac pacemaker: ICD-10-CM

## 2018-02-11 DIAGNOSIS — I480 Paroxysmal atrial fibrillation: ICD-10-CM

## 2018-02-11 DIAGNOSIS — C801 Malignant (primary) neoplasm, unspecified: ICD-10-CM

## 2018-02-11 DIAGNOSIS — H9190 Unspecified hearing loss, unspecified ear: ICD-10-CM

## 2018-02-11 DIAGNOSIS — I495 Sick sinus syndrome: Principal | ICD-10-CM

## 2018-02-11 DIAGNOSIS — F419 Anxiety disorder, unspecified: ICD-10-CM

## 2018-02-11 DIAGNOSIS — C4491 Basal cell carcinoma of skin, unspecified: ICD-10-CM

## 2018-02-11 DIAGNOSIS — Z79899 Other long term (current) drug therapy: ICD-10-CM

## 2018-02-11 DIAGNOSIS — I4891 Unspecified atrial fibrillation: Principal | ICD-10-CM

## 2018-02-11 DIAGNOSIS — I10 Essential (primary) hypertension: ICD-10-CM

## 2018-02-11 DIAGNOSIS — R5383 Other fatigue: ICD-10-CM

## 2018-02-11 DIAGNOSIS — Z9889 Other specified postprocedural states: ICD-10-CM

## 2018-02-11 DIAGNOSIS — N400 Enlarged prostate without lower urinary tract symptoms: ICD-10-CM

## 2018-02-11 DIAGNOSIS — I483 Typical atrial flutter: ICD-10-CM

## 2018-02-22 ENCOUNTER — Ambulatory Visit: Admit: 2018-02-22 | Discharge: 2018-02-22 | Payer: MEDICARE

## 2018-02-22 DIAGNOSIS — I480 Paroxysmal atrial fibrillation: Principal | ICD-10-CM

## 2018-05-23 ENCOUNTER — Ambulatory Visit: Admit: 2018-05-23 | Discharge: 2018-05-24 | Payer: MEDICARE

## 2018-05-23 ENCOUNTER — Encounter: Admit: 2018-05-23 | Discharge: 2018-05-23 | Payer: MEDICARE

## 2018-05-23 DIAGNOSIS — Z950 Presence of cardiac pacemaker: Principal | ICD-10-CM

## 2018-05-23 DIAGNOSIS — I420 Dilated cardiomyopathy: ICD-10-CM

## 2018-05-23 DIAGNOSIS — I480 Paroxysmal atrial fibrillation: ICD-10-CM

## 2018-05-24 DIAGNOSIS — Z950 Presence of cardiac pacemaker: Principal | ICD-10-CM

## 2018-06-18 ENCOUNTER — Ambulatory Visit: Admit: 2018-06-18 | Discharge: 2018-06-18 | Payer: MEDICARE

## 2018-06-18 ENCOUNTER — Encounter: Admit: 2018-06-18 | Discharge: 2018-06-18 | Payer: MEDICARE

## 2018-06-18 DIAGNOSIS — N400 Enlarged prostate without lower urinary tract symptoms: ICD-10-CM

## 2018-06-18 DIAGNOSIS — Z7901 Long term (current) use of anticoagulants: ICD-10-CM

## 2018-06-18 DIAGNOSIS — C801 Malignant (primary) neoplasm, unspecified: ICD-10-CM

## 2018-06-18 DIAGNOSIS — Z950 Presence of cardiac pacemaker: ICD-10-CM

## 2018-06-18 DIAGNOSIS — I495 Sick sinus syndrome: Principal | ICD-10-CM

## 2018-06-18 DIAGNOSIS — Z9889 Other specified postprocedural states: ICD-10-CM

## 2018-06-18 DIAGNOSIS — C4491 Basal cell carcinoma of skin, unspecified: ICD-10-CM

## 2018-06-18 DIAGNOSIS — F419 Anxiety disorder, unspecified: ICD-10-CM

## 2018-06-18 DIAGNOSIS — H9190 Unspecified hearing loss, unspecified ear: ICD-10-CM

## 2018-06-18 DIAGNOSIS — I4891 Unspecified atrial fibrillation: Principal | ICD-10-CM

## 2018-06-18 DIAGNOSIS — I483 Typical atrial flutter: ICD-10-CM

## 2018-06-18 DIAGNOSIS — I480 Paroxysmal atrial fibrillation: ICD-10-CM

## 2018-06-18 DIAGNOSIS — I10 Essential (primary) hypertension: ICD-10-CM

## 2018-06-20 ENCOUNTER — Encounter: Admit: 2018-06-20 | Discharge: 2018-06-20 | Payer: MEDICARE

## 2018-06-20 DIAGNOSIS — I10 Essential (primary) hypertension: Principal | ICD-10-CM

## 2018-06-20 MED ORDER — METOPROLOL SUCCINATE 25 MG PO TB24
25 mg | ORAL_TABLET | Freq: Every day | ORAL | 3 refills | 90.00000 days | Status: AC
Start: 2018-06-20 — End: 2019-01-08

## 2018-07-29 ENCOUNTER — Encounter: Admit: 2018-07-29 | Discharge: 2018-07-29 | Payer: MEDICARE

## 2018-08-05 ENCOUNTER — Encounter: Admit: 2018-08-05 | Discharge: 2018-08-05 | Payer: MEDICARE

## 2018-08-05 ENCOUNTER — Ambulatory Visit: Admit: 2018-08-05 | Discharge: 2018-08-05 | Payer: MEDICARE

## 2018-08-05 DIAGNOSIS — H9190 Unspecified hearing loss, unspecified ear: Secondary | ICD-10-CM

## 2018-08-05 DIAGNOSIS — C801 Malignant (primary) neoplasm, unspecified: Secondary | ICD-10-CM

## 2018-08-05 DIAGNOSIS — Z950 Presence of cardiac pacemaker: Secondary | ICD-10-CM

## 2018-08-05 DIAGNOSIS — I480 Paroxysmal atrial fibrillation: Secondary | ICD-10-CM

## 2018-08-05 DIAGNOSIS — F419 Anxiety disorder, unspecified: Secondary | ICD-10-CM

## 2018-08-05 DIAGNOSIS — I495 Sick sinus syndrome: Secondary | ICD-10-CM

## 2018-08-05 DIAGNOSIS — I10 Essential (primary) hypertension: Secondary | ICD-10-CM

## 2018-08-05 DIAGNOSIS — C4491 Basal cell carcinoma of skin, unspecified: Secondary | ICD-10-CM

## 2018-08-05 DIAGNOSIS — I483 Typical atrial flutter: Secondary | ICD-10-CM

## 2018-08-05 DIAGNOSIS — I4891 Unspecified atrial fibrillation: Secondary | ICD-10-CM

## 2018-08-05 DIAGNOSIS — N400 Enlarged prostate without lower urinary tract symptoms: Secondary | ICD-10-CM

## 2018-08-22 ENCOUNTER — Ambulatory Visit: Admit: 2018-08-22 | Discharge: 2018-08-23 | Payer: MEDICARE

## 2018-08-23 DIAGNOSIS — Z950 Presence of cardiac pacemaker: Secondary | ICD-10-CM

## 2018-08-23 DIAGNOSIS — I480 Paroxysmal atrial fibrillation: Secondary | ICD-10-CM

## 2018-08-23 DIAGNOSIS — I420 Dilated cardiomyopathy: Secondary | ICD-10-CM

## 2018-09-25 ENCOUNTER — Encounter: Admit: 2018-09-25 | Discharge: 2018-09-25 | Payer: MEDICARE

## 2018-11-21 ENCOUNTER — Encounter: Admit: 2018-11-21 | Discharge: 2018-11-21 | Payer: MEDICARE

## 2018-11-21 ENCOUNTER — Ambulatory Visit: Admit: 2018-11-21 | Discharge: 2018-11-21 | Payer: MEDICARE

## 2018-11-21 DIAGNOSIS — I420 Dilated cardiomyopathy: ICD-10-CM

## 2018-11-21 DIAGNOSIS — Z950 Presence of cardiac pacemaker: ICD-10-CM

## 2018-11-21 DIAGNOSIS — I480 Paroxysmal atrial fibrillation: ICD-10-CM

## 2018-11-21 DIAGNOSIS — I495 Sick sinus syndrome: Principal | ICD-10-CM

## 2018-12-22 IMAGING — CT Spine^1_L_SPINE (Adult)
1 of 7 series · 6 of 14 positions shown, 8 images · non-contrast
Comparison: none

[Series 2: l-spine 1.5 soft tissue · axial · 0.31mm/px · z∈[-260,-65]mm · 6 of 184 slices shown, 8 images]
[im 27/184  soft-tissue]
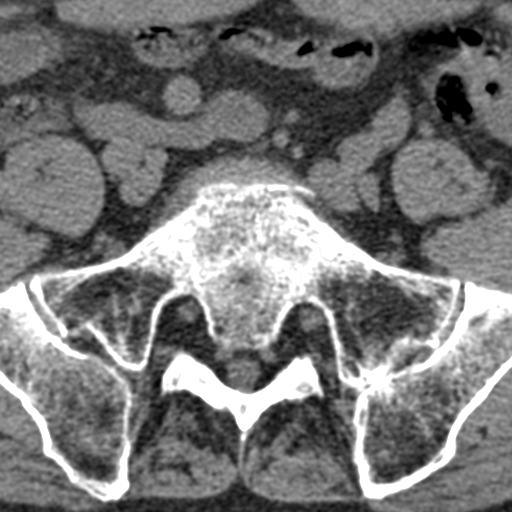
[im 27/184  bone]
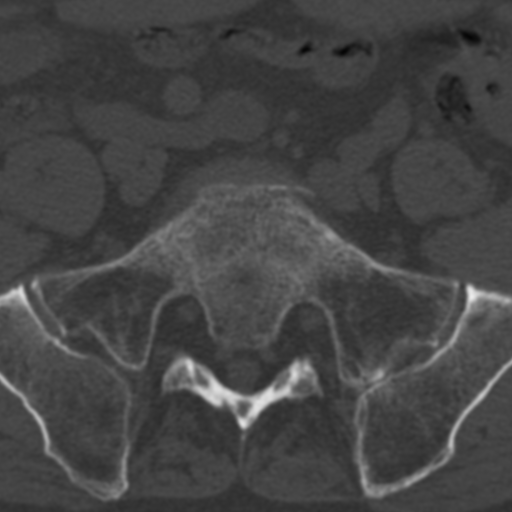
[im 53/184  bone]
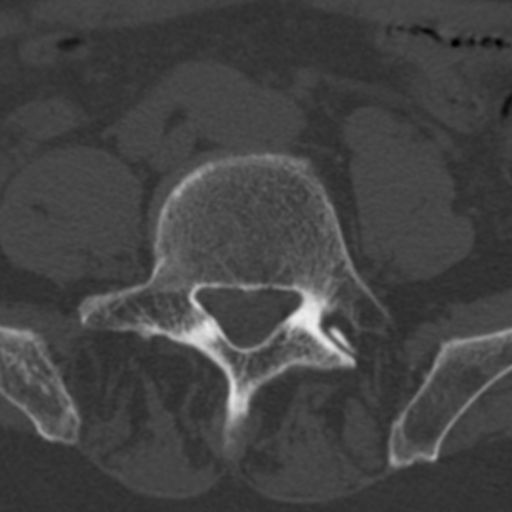
[im 79/184  bone]
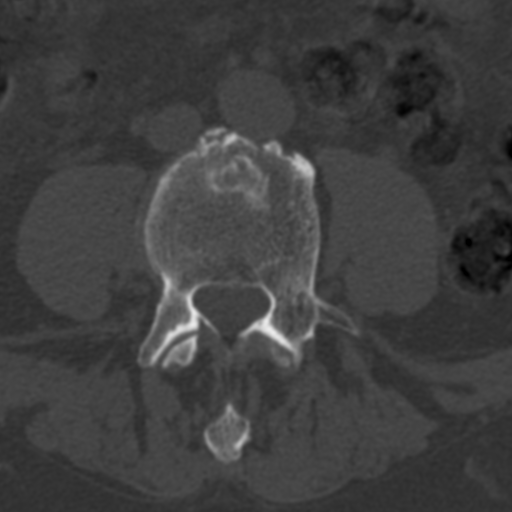
[im 105/184  bone]
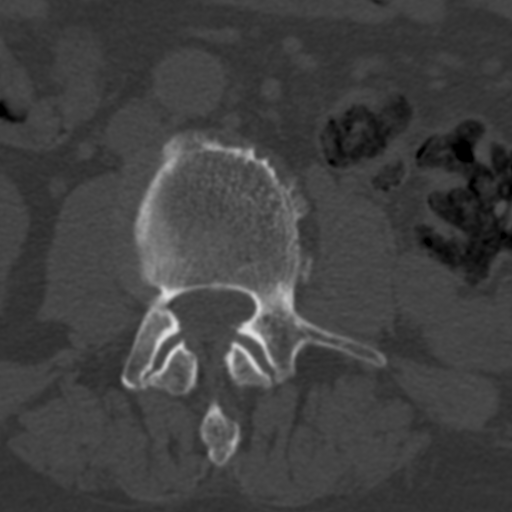
[im 131/184  soft-tissue]
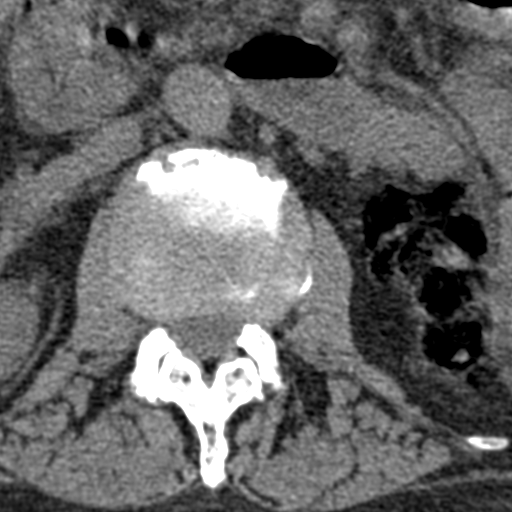
[im 131/184  bone]
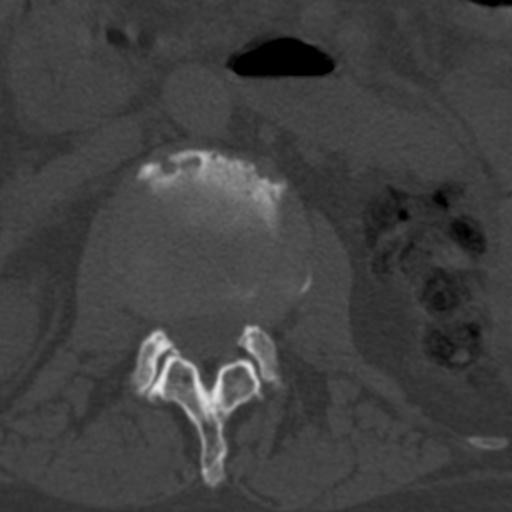
[im 157/184  bone]
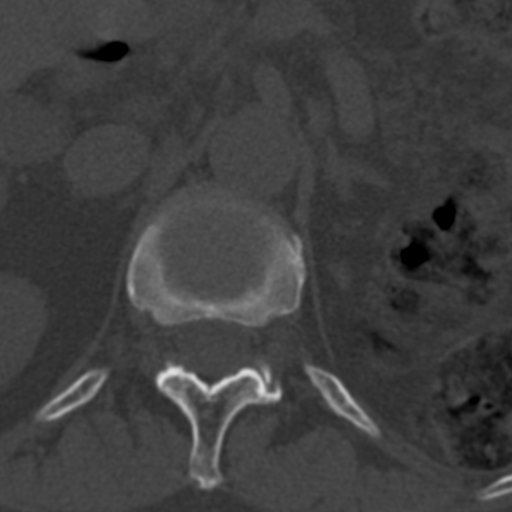

[6 of 14 positions shown; findings below may reference images not displayed]

EXAM
CT of the lumbar spine

INDICATION
Back pain

TECHNIQUE
All CT scans at this facility use dose modulation, iterative reconstruction, and/or weight based
dosing when appropriate to reduce radiation dose to as low as reasonably achievable.
CT of the lumbar spine without contrast was performed.

COMPARISONS
None available at the time of dictation.

FINDINGS
For the purposes of numbering of this examination, there are 5 lumbar type non-rib-bearing vertebra

No acute fracture or aggressive focal osseous lesion. There is no endplate compression fracture.
Background of diffuse osteopenia.

Degenerative changes: There is severe intervertebral disc space narrowing, more asymmetrically
involving the right aspect of the intervertebral disc space at L5-S1. There is associated vacuum
phenomenon and likely a posterior disc extrusion which is incompletely characterize. The visualized
bony spinal canal narrowing. There is mild right L5-S1 neural foraminal narrowing likely secondary
to a combination intervertebral disc height loss, posterior superiorly projecting spurring at the
inferior endplate of L5, and a small amount of disc herniation (series 3, image 60). Mild to
moderate intervertebral disc height loss at L2-L3. The sacroiliac joints are symmetric, however
there is mild degenerative change with vacuum phenomenon and marginal osteophytosis.

In the partially visualized abdomen/pelvis, there is mild bilateral adrenal gland thickening, which
may be suggestive of mild adrenal hyperplasia. The left kidney is not visualized and may be second
[place] to prior nephrectomy.

IMPRESSION
Multilevel degenerative changes as detailed above.

No bony spinal canal narrowing.

Mild right L5-S1 neural foraminal narrowing likely secondary to a combination of intervertebral
disc height loss, posteriorly superiorly projecting spurring of the inferior endplate of L5, and a
small disc herniation. Severe intervertebral disc height loss at L5-S1.

Mild degenerative change of the sacroiliac joints.

Tech Notes:

PT C/O LOW BACK PAIN THAT RADIATES INTO RT KNEE X WEEKS - RECENT XRAYS DONE - [place]

## 2019-01-08 ENCOUNTER — Encounter: Admit: 2019-01-08 | Discharge: 2019-01-08

## 2019-01-08 ENCOUNTER — Ambulatory Visit: Admit: 2019-01-08 | Discharge: 2019-01-09

## 2019-01-08 DIAGNOSIS — I483 Typical atrial flutter: Secondary | ICD-10-CM

## 2019-01-08 DIAGNOSIS — C4491 Basal cell carcinoma of skin, unspecified: Secondary | ICD-10-CM

## 2019-01-08 DIAGNOSIS — G4752 REM sleep behavior disorder: Secondary | ICD-10-CM

## 2019-01-08 DIAGNOSIS — Z950 Presence of cardiac pacemaker: Secondary | ICD-10-CM

## 2019-01-08 DIAGNOSIS — I495 Sick sinus syndrome: Secondary | ICD-10-CM

## 2019-01-08 DIAGNOSIS — H9190 Unspecified hearing loss, unspecified ear: Secondary | ICD-10-CM

## 2019-01-08 DIAGNOSIS — C801 Malignant (primary) neoplasm, unspecified: Secondary | ICD-10-CM

## 2019-01-08 DIAGNOSIS — I10 Essential (primary) hypertension: Secondary | ICD-10-CM

## 2019-01-08 DIAGNOSIS — N400 Enlarged prostate without lower urinary tract symptoms: Secondary | ICD-10-CM

## 2019-01-08 DIAGNOSIS — I480 Paroxysmal atrial fibrillation: Secondary | ICD-10-CM

## 2019-01-08 DIAGNOSIS — I4891 Unspecified atrial fibrillation: Secondary | ICD-10-CM

## 2019-01-08 DIAGNOSIS — F419 Anxiety disorder, unspecified: Secondary | ICD-10-CM

## 2019-01-08 MED ORDER — METOPROLOL SUCCINATE 25 MG PO TB24
12.5 mg | Freq: Two times a day (BID) | ORAL | 0 refills | 90.00000 days | Status: DC
Start: 2019-01-08 — End: 2019-07-16
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
## 2019-01-08 NOTE — Assessment & Plan Note
He had a remote transmission in April showing normal device function.
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
## 2019-01-08 NOTE — Assessment & Plan Note
BP is a little too low--we'll cut back to 12.5 mg qhs.

## 2019-01-08 NOTE — Assessment & Plan Note
No recurrence of atrial fibrillation after his ablation procedure about a year ago.  He remains on oral anticoagulation without bleeding complications.

## 2019-01-15 ENCOUNTER — Encounter: Admit: 2019-01-15 | Discharge: 2019-01-15

## 2019-01-20 ENCOUNTER — Ambulatory Visit: Admit: 2019-01-20 | Discharge: 2019-01-21

## 2019-01-20 ENCOUNTER — Encounter: Admit: 2019-01-20 | Discharge: 2019-01-20

## 2019-01-20 DIAGNOSIS — I480 Paroxysmal atrial fibrillation: Secondary | ICD-10-CM

## 2019-02-20 ENCOUNTER — Encounter: Admit: 2019-02-20 | Discharge: 2019-02-20

## 2019-02-20 ENCOUNTER — Ambulatory Visit: Admit: 2019-02-20 | Discharge: 2019-02-20

## 2019-02-20 DIAGNOSIS — I420 Dilated cardiomyopathy: Secondary | ICD-10-CM

## 2019-02-20 DIAGNOSIS — Z950 Presence of cardiac pacemaker: Secondary | ICD-10-CM

## 2019-02-20 DIAGNOSIS — I495 Sick sinus syndrome: Secondary | ICD-10-CM

## 2019-02-20 DIAGNOSIS — I480 Paroxysmal atrial fibrillation: Secondary | ICD-10-CM

## 2019-02-27 ENCOUNTER — Encounter: Admit: 2019-02-27 | Discharge: 2019-02-27

## 2019-06-17 ENCOUNTER — Encounter: Admit: 2019-06-17 | Discharge: 2019-06-17 | Payer: MEDICARE

## 2019-06-17 DIAGNOSIS — I480 Paroxysmal atrial fibrillation: Secondary | ICD-10-CM

## 2019-06-17 DIAGNOSIS — I420 Dilated cardiomyopathy: Secondary | ICD-10-CM

## 2019-06-17 DIAGNOSIS — Z950 Presence of cardiac pacemaker: Secondary | ICD-10-CM

## 2019-07-16 ENCOUNTER — Encounter: Admit: 2019-07-16 | Discharge: 2019-07-16 | Payer: MEDICARE

## 2019-07-16 DIAGNOSIS — H9190 Unspecified hearing loss, unspecified ear: Secondary | ICD-10-CM

## 2019-07-16 DIAGNOSIS — C801 Malignant (primary) neoplasm, unspecified: Secondary | ICD-10-CM

## 2019-07-16 DIAGNOSIS — Z950 Presence of cardiac pacemaker: Secondary | ICD-10-CM

## 2019-07-16 DIAGNOSIS — I10 Essential (primary) hypertension: Secondary | ICD-10-CM

## 2019-07-16 DIAGNOSIS — C4491 Basal cell carcinoma of skin, unspecified: Secondary | ICD-10-CM

## 2019-07-16 DIAGNOSIS — I483 Typical atrial flutter: Secondary | ICD-10-CM

## 2019-07-16 DIAGNOSIS — N400 Enlarged prostate without lower urinary tract symptoms: Secondary | ICD-10-CM

## 2019-07-16 DIAGNOSIS — I4891 Unspecified atrial fibrillation: Secondary | ICD-10-CM

## 2019-07-16 DIAGNOSIS — F419 Anxiety disorder, unspecified: Secondary | ICD-10-CM

## 2019-07-16 DIAGNOSIS — I495 Sick sinus syndrome: Secondary | ICD-10-CM

## 2019-07-16 DIAGNOSIS — I480 Paroxysmal atrial fibrillation: Secondary | ICD-10-CM

## 2019-07-16 MED ORDER — METOPROLOL SUCCINATE 25 MG PO TB24
25 mg | Freq: Two times a day (BID) | ORAL | 0 refills | 90.00000 days | Status: DC
Start: 2019-07-16 — End: 2019-09-16

## 2019-07-16 NOTE — Progress Notes
Date of Service: 07/16/2019    Stanley Campbell is a 78 y.o. male.       HPI     Stanley Campbell and his wife were in the [place] clinic today for follow-up regarding paroxysmal atrial arrhythmias and cardiac pacemaker.  He has been working with Dr. Doran Heater and has been using a BiPAP device for the past 6 weeks.  His wife is satisfied that he is sleeping much better but it sounds like it took a fairly heroic effort on the part of both the doctor and the wife to get Stanley Campbell to agree to using the device.    He does not seem to be having any adverse cardiac symptoms.  He has been walking a little bit more and his ability to exercise is gradually improved over the last several months.  His blood pressure remains pretty variable.  When he is out in the heat in the summer months he does not hydrate well at all and tends to need a lot less blood pressure medication when it is hot.  He is bumped his metoprolol dose up now that it is cooler and his blood pressures are generally within an acceptable range.    He has not had any bleeding problems related to anticoagulation.  He denies any syncope or near syncope.         Vitals:    07/16/19 0933 07/16/19 0935   BP: (!) 146/92 (!) 140/90   BP Source: Arm, Left Upper Arm, Right Upper   Patient Position: Sitting Sitting   Pulse: 60    Temp: 36.5 ?C (97.7 ?F)    TempSrc: Oral    SpO2: 93%    Weight: 88.2 kg (194 lb 6.4 oz)    Height: 1.829 m (6')    PainSc: Zero      Body mass index is 26.37 kg/m?Marland Kitchen     Past Medical History  Patient Active Problem List    Diagnosis Date Noted   ? 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 S/P ablation of atrial flutter 02/18/2017   ? Typical atrial flutter (HCC) 02/04/2017     Added automatically from request for surgery [phone] [phone]     ? 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 Paroxysmal atrial fibrillation (HCC) 08/18/2015     ~2005 - Isolated episode of PAF 07/2015 - AF episode while in [facility] ED for hypertension  09/11/16 - Rapid AF during treadmill MPI at [facility] study  10/11/17 Successful Paroxysmal atrial fibrillation cryoballoon ablation by Dr. Naoma Diener     ? Hypertension 08/18/2015   ? BPH (benign prostatic hyperplasia) 08/18/2015   ? Anxiety 08/18/2015         Review of Systems   Constitution: Negative.   HENT: Negative.    Eyes: Negative.    Cardiovascular: Negative.    Respiratory: Positive for sleep disturbances due to breathing.    Endocrine: Positive for polyuria.   Hematologic/Lymphatic: Bruises/bleeds easily.   Skin: Positive for skin cancer.   Musculoskeletal: Negative.    Gastrointestinal: Positive for constipation.   Genitourinary: Positive for frequency.   Neurological: Positive for light-headedness.   Psychiatric/Behavioral: Negative.    Allergic/Immunologic: Negative.        Physical Exam    Physical Exam   General Appearance: no distress   Skin: warm, no ulcers or xanthomas   Digits and Nails: no cyanosis or clubbing   Eyes: conjunctivae and lids normal, pupils are  equal and round   Teeth/Gums/Palate: dentition unremarkable, no lesions   Lips & Oral Mucosa: no pallor or cyanosis   Neck Veins: normal JVP , neck veins are not distended   Thyroid: no nodules, masses, tenderness or enlargement   Chest Inspection: chest is normal in appearance   Respiratory Effort: breathing comfortably, no respiratory distress   Auscultation/Percussion: lungs clear to auscultation, no rales or rhonchi, no wheezing   PMI: PMI not enlarged or displaced   Cardiac Rhythm: regular rhythm and normal rate   Cardiac Auscultation: S1, S2 normal, no rub, no gallop   Murmurs: no murmur   Peripheral Circulation: normal peripheral circulation   Carotid Arteries: normal carotid upstroke bilaterally, no bruits   Radial Arteries: normal symmetric radial pulses   Abdominal Aorta: no abdominal aortic bruit   Pedal Pulses: normal symmetric pedal pulses Lower Extremity Edema: no lower extremity edema   Abdominal Exam: soft, non-tender, no masses, bowel sounds normal   Liver & Spleen: no organomegaly   Gait & Station: walks without assistance   Muscle Strength: normal muscle tone   Orientation: oriented to time, place and person   Affect & Mood: appropriate and sustained affect   Language and Memory: patient responsive and seems to comprehend information   Neurologic Exam: neurological assessment grossly intact   Other: moves all extremities      Problems Addressed Today  Encounter Diagnoses   Name Primary?   ? Essential hypertension    ? Paroxysmal atrial fibrillation (HCC)    ? Presence of cardiac pacemaker        Assessment and Plan       Paroxysmal atrial fibrillation (HCC)  It has been nearly 2 years since he underwent ablation and he has had no symptoms to suggest recurrent dysrhythmia.  He is tolerating Eliquis without bleeding complications.    Hypertension  He has pretty variable blood pressure and tends to need less medication during the hot summer months than in the winter.    Presence of cardiac pacemaker  He had a device check in October showing about 38% atrial pacing, no AF.  Normal device function, expected 12 years' battery longevity.      Current Medications (including today's revisions)  ? apixaban (ELIQUIS) 5 mg tablet Take one tablet by mouth twice daily.   ? aspirin EC 81 mg tablet Take 1 Tab by mouth daily. Take with food.   ? CALCIUM PO Take  by mouth daily.   ? docusate (COLACE) 100 mg capsule Take 100 mg by mouth at bedtime daily.   ? finasteride (PROSCAR) 5 mg tablet Take 5 mg by mouth daily.   ? LORazepam (ATIVAN) 0.5 mg tablet Take 0.5 tablets by mouth daily.   ? LUTEIN PO Take 1 tablet by mouth daily.   ? metoprolol XL (TOPROL XL) 25 mg extended release tablet Take one tablet by mouth twice daily.   ? milk of magnesia (CONC) 2,400 mg/10 mL oral suspension Take  by mouth at bedtime daily. ? tamsulosin (FLOMAX) 0.4 mg capsule Take 0.4 mg by mouth daily. Do not crush, chew or open capsules. Take 30 minutes following the same meal each day.

## 2019-07-16 NOTE — Assessment & Plan Note
He has pretty variable blood pressure and tends to need less medication during the hot summer months than in the winter.

## 2019-07-16 NOTE — Assessment & Plan Note
He had a device check in October showing about 38% atrial pacing, no AF.  Normal device function, expected 12 years' battery longevity.

## 2019-07-16 NOTE — Assessment & Plan Note
It has been nearly 2 years since he underwent ablation and he has had no symptoms to suggest recurrent dysrhythmia.  He is tolerating Eliquis without bleeding complications.

## 2019-09-16 ENCOUNTER — Encounter: Admit: 2019-09-16 | Discharge: 2019-09-16 | Payer: MEDICARE

## 2019-09-16 DIAGNOSIS — I10 Essential (primary) hypertension: Secondary | ICD-10-CM

## 2019-09-16 MED ORDER — METOPROLOL SUCCINATE 25 MG PO TB24
ORAL_TABLET | Freq: Every day | ORAL | 3 refills | 90.00000 days | Status: DC
Start: 2019-09-16 — End: 2020-01-21

## 2020-01-14 ENCOUNTER — Encounter: Admit: 2020-01-14 | Discharge: 2020-01-14 | Payer: MEDICARE

## 2020-01-14 ENCOUNTER — Ambulatory Visit: Admit: 2020-01-14 | Discharge: 2020-01-14 | Payer: MEDICARE

## 2020-01-14 DIAGNOSIS — Z950 Presence of cardiac pacemaker: Secondary | ICD-10-CM

## 2020-01-21 ENCOUNTER — Encounter: Admit: 2020-01-21 | Discharge: 2020-01-21 | Payer: MEDICARE

## 2020-01-21 DIAGNOSIS — Z950 Presence of cardiac pacemaker: Secondary | ICD-10-CM

## 2020-01-21 DIAGNOSIS — F419 Anxiety disorder, unspecified: Secondary | ICD-10-CM

## 2020-01-21 DIAGNOSIS — H9190 Unspecified hearing loss, unspecified ear: Secondary | ICD-10-CM

## 2020-01-21 DIAGNOSIS — N400 Enlarged prostate without lower urinary tract symptoms: Secondary | ICD-10-CM

## 2020-01-21 DIAGNOSIS — I495 Sick sinus syndrome: Secondary | ICD-10-CM

## 2020-01-21 DIAGNOSIS — I10 Essential (primary) hypertension: Secondary | ICD-10-CM

## 2020-01-21 DIAGNOSIS — I483 Typical atrial flutter: Secondary | ICD-10-CM

## 2020-01-21 DIAGNOSIS — I4891 Unspecified atrial fibrillation: Secondary | ICD-10-CM

## 2020-01-21 DIAGNOSIS — C4491 Basal cell carcinoma of skin, unspecified: Secondary | ICD-10-CM

## 2020-01-21 DIAGNOSIS — I480 Paroxysmal atrial fibrillation: Secondary | ICD-10-CM

## 2020-01-21 DIAGNOSIS — C801 Malignant (primary) neoplasm, unspecified: Secondary | ICD-10-CM

## 2020-01-21 NOTE — Assessment & Plan Note
The only blood pressure lowering medication he still takes his metoprolol and I discontinued this today.

## 2020-01-21 NOTE — Progress Notes
Date of Service: 01/21/2020    Stanley Campbell is a 79 y.o. male.       HPI     Stanley Campbell was in the [place] clinic today for follow-up regarding hypertension, pacemaker, and history of flutter ablation.  He is stressed now because his wife has developed a thyroid disorder and he is pretty symptomatic.  Today is the first time I have ever seen Stanley Campbell in clinic without his wife accompanying him.    He has been having trouble with lightheadedness and fatigue.  He has attributed the symptoms to the lorazepam but when he showed me his home blood pressure readings I think it is actually because his blood pressures too low.  He is always been a whitecoat hypertension guy because he absolutely hates to go to the doctor's office.  The blood pressure at Dr. Gerrit Halls office as well as our blood pressure today is a lot higher than what he gets at home and he brought his monitor today and if anything his monitor is higher than what we get in the clinic.    We checked it a couple of times and the systolic pressure that we get is about 15 mmHg lower than what he is getting with his cough.  I looked at the cough and it is the appropriate size and he says that it is only a couple of years old so I am not sure how to explain the discrepancy but I am absolutely certain that we need to go off his home blood pressure readings for treatment and not use what we get in the clinic.  If we use clinic blood pressures we are going to overtreat him and make him symptomatic.       Vitals:    01/21/20 0934   BP: (!) 144/86   BP Source: Arm, Left Upper   Patient Position: Sitting   Pulse: 67   SpO2: 92%   Weight: 85.7 kg (189 lb)   Height: 1.829 m (6')   PainSc: Zero     Body mass index is 25.63 kg/m?Marland Kitchen     Past Medical History  Patient Active Problem List    Diagnosis Date Noted   ? 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 S/P ablation of atrial flutter 02/18/2017   ? 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 Paroxysmal atrial fibrillation (HCC) 08/18/2015     ~2005 - Isolated episode of PAF  07/2015 - AF episode while in [facility] ED for hypertension  09/11/16 - Rapid AF during treadmill MPI at [facility] study  10/11/17 Successful Paroxysmal atrial fibrillation cryoballoon ablation by Dr. Naoma Diener     ? Hypertension 08/18/2015   ? BPH (benign prostatic hyperplasia) 08/18/2015   ? Anxiety 08/18/2015         Review of Systems   Constitution: Negative.   HENT: Negative.    Eyes: Negative.    Cardiovascular: Negative.    Respiratory: Negative.    Endocrine: Negative.    Hematologic/Lymphatic: Negative.    Skin: Negative.    Musculoskeletal: Negative.    Gastrointestinal: Negative.    Genitourinary: Negative.    Neurological: Positive for excessive daytime sleepiness, dizziness and light-headedness.   Psychiatric/Behavioral: Negative.    Allergic/Immunologic: Negative.        Physical Exam    Physical Exam   General  Appearance: no distress   Skin: warm, no ulcers or xanthomas   Digits and Nails: no cyanosis or clubbing   Eyes: conjunctivae and lids normal, pupils are equal and round   Teeth/Gums/Palate: dentition unremarkable, no lesions   Lips & Oral Mucosa: no pallor or cyanosis   Neck Veins: normal JVP , neck veins are not distended   Thyroid: no nodules, masses, tenderness or enlargement   Chest Inspection: chest is normal in appearance   Respiratory Effort: breathing comfortably, no respiratory distress   Auscultation/Percussion: lungs clear to auscultation, no rales or rhonchi, no wheezing   PMI: PMI not enlarged or displaced   Cardiac Rhythm: regular rhythm and normal rate   Cardiac Auscultation: S1, S2 normal, no rub, no gallop   Murmurs: no murmur   Peripheral Circulation: normal peripheral circulation   Carotid Arteries: normal carotid upstroke bilaterally, no bruits   Radial Arteries: normal symmetric radial pulses   Abdominal Aorta: no abdominal aortic bruit   Pedal Pulses: normal symmetric pedal pulses   Lower Extremity Edema: no lower extremity edema   Abdominal Exam: soft, non-tender, no masses, bowel sounds normal   Liver & Spleen: no organomegaly   Gait & Station: walks without assistance   Muscle Strength: normal muscle tone   Orientation: oriented to time, place and person   Affect & Mood: appropriate and sustained affect   Language and Memory: patient responsive and seems to comprehend information   Neurologic Exam: neurological assessment grossly intact   Other: moves all extremities      Problems Addressed Today  Encounter Diagnoses   Name Primary?   ? Essential hypertension    ? Paroxysmal atrial fibrillation (HCC)    ? Presence of cardiac pacemaker        Assessment and Plan       Hypertension  The only blood pressure lowering medication he still takes his metoprolol and I discontinued this today.    Paroxysmal atrial fibrillation (HCC)  He has had no recurrence of atrial arrhythmia since his ablation procedure a couple of years ago.  His pacemaker serves as an accurate way to monitor for any recurrence.  He tolerates the Eliquis without significant bleeding complications.    Presence of cardiac pacemaker  He had a remote transmission in April showing normal device function.      Current Medications (including today's revisions)  ? apixaban (ELIQUIS) 5 mg tablet Take one tablet by mouth twice daily.   ? aspirin EC 81 mg tablet Take 1 Tab by mouth daily. Take with food.   ? CALCIUM PO Take  by mouth daily.   ? docusate (COLACE) 100 mg capsule Take 100 mg by mouth at bedtime daily.   ? finasteride (PROSCAR) 5 mg tablet Take 5 mg by mouth daily.   ? LUTEIN PO Take 1 tablet by mouth daily.   ? milk of magnesia (CONC) 2,400 mg/10 mL oral suspension Take  by mouth at bedtime daily.   ? tamsulosin (FLOMAX) 0.4 mg capsule Take 0.4 mg by mouth daily. Do not crush, chew or open capsules. Take 30 minutes following the same meal each day.     Total time spent on today's office visit was 30 minutes.  This includes face-to-face in person visit with patient as well as nonface-to-face time including review of the EMR, outside records, labs, radiologic studies, echocardiogram & other cardiovascular studies, formation of treatment plan, after visit summary, future disposition, and lastly on documentation.

## 2020-01-21 NOTE — Assessment & Plan Note
He had a remote transmission in April showing normal device function.

## 2020-01-21 NOTE — Assessment & Plan Note
He has had no recurrence of atrial arrhythmia since his ablation procedure a couple of years ago.  His pacemaker serves as an accurate way to monitor for any recurrence.  He tolerates the Eliquis without significant bleeding complications.

## 2020-02-19 ENCOUNTER — Encounter: Admit: 2020-02-19 | Discharge: 2020-02-19 | Payer: MEDICARE

## 2020-02-19 NOTE — Telephone Encounter
Called and left message requesting return cal to discuss symptoms.

## 2020-02-19 NOTE — Telephone Encounter
-----   Message from Connye Burkitt, RN sent at 02/19/2020 11:17 AM CDT -----  Regarding: FW: NSVT with PPM    ----- Message -----  From: Algis Liming  Sent: 02/19/2020  11:16 AM CDT  To: [facility] Nurse Gen Card Team Red  Subject: NSVT with PPM                                    Recent remote transmission reporting 1 NSVT episode. Episode occurred on 01/25/20 lasting ~ 11 beasts. Avg V rate: 187 bpm. Please follow up with patient as needed.    Thanks,  Morrie Sheldon

## 2020-05-20 ENCOUNTER — Encounter: Admit: 2020-05-20 | Discharge: 2020-05-20 | Payer: MEDICARE

## 2020-07-08 ENCOUNTER — Encounter: Admit: 2020-07-08 | Discharge: 2020-07-08 | Payer: MEDICARE

## 2020-07-12 ENCOUNTER — Encounter: Admit: 2020-07-12 | Discharge: 2020-07-12 | Payer: MEDICARE

## 2020-07-12 DIAGNOSIS — Z950 Presence of cardiac pacemaker: Secondary | ICD-10-CM

## 2020-07-12 DIAGNOSIS — I10 Essential (primary) hypertension: Secondary | ICD-10-CM

## 2020-07-12 DIAGNOSIS — I4891 Unspecified atrial fibrillation: Secondary | ICD-10-CM

## 2020-07-12 DIAGNOSIS — I495 Sick sinus syndrome: Secondary | ICD-10-CM

## 2020-07-12 DIAGNOSIS — C801 Malignant (primary) neoplasm, unspecified: Secondary | ICD-10-CM

## 2020-07-12 DIAGNOSIS — N400 Enlarged prostate without lower urinary tract symptoms: Secondary | ICD-10-CM

## 2020-07-12 DIAGNOSIS — C4491 Basal cell carcinoma of skin, unspecified: Secondary | ICD-10-CM

## 2020-07-12 DIAGNOSIS — H9190 Unspecified hearing loss, unspecified ear: Secondary | ICD-10-CM

## 2020-07-12 DIAGNOSIS — F419 Anxiety disorder, unspecified: Secondary | ICD-10-CM

## 2020-07-12 DIAGNOSIS — I483 Typical atrial flutter: Secondary | ICD-10-CM

## 2020-07-12 DIAGNOSIS — I480 Paroxysmal atrial fibrillation: Secondary | ICD-10-CM

## 2020-07-12 NOTE — Assessment & Plan Note
His device check shows no recurrence of atrial arrhythmia.  He will plan to continue oral anticoagulation indefinitely.

## 2020-07-12 NOTE — Assessment & Plan Note
I told him to take 1/4 teaspoon of salt with some warm water on days that his blood pressure is relatively low.  The only medication that he currently takes that might lower his blood pressure is the Flomax, but he needs this so I think we will just plan to continue with.

## 2020-07-12 NOTE — Assessment & Plan Note
He had an in office device check in June and has remote transmissions on a quarterly basis.  His device functions normally and he has had no evidence of recurrent atrial arrhythmia.

## 2020-07-12 NOTE — Progress Notes
Date of Service: 07/12/2020    Stanley Campbell is a 79 y.o. male.       HPI     Stanley Campbell and his wife were in the [place] clinic today for follow-up regarding his pacemaker and history of paroxysmal atrial flutter.  The daughter, Marcelino Duster, is a Engineer, civil (consulting) here at [employer].    Stanley Campbell seems to be doing pretty well.  He still has some problems with fatigue that are associated with relatively low blood pressure.  He showed me a record of his blood pressure readings and most of the time his blood pressure is good but occasionally he will be down in the 90s systolic.    He is not having any trouble with palpitations or lightheadedness.  He is using Eliquis and has not had any bleeding complications.  He just had a colonoscopy yesterday and held the Eliquis for a couple of days.    He had a remote device check last month that shows no recurrence of atrial fibrillation.         Vitals:    07/12/20 0830 07/12/20 0835   BP: 130/84 128/84   BP Source: Arm, Left Upper Arm, Right Upper   Patient Position: Sitting Sitting   Pulse: 66    SpO2: 98%    Weight: 85 kg (187 lb 6.4 oz)    Height: 1.829 m (6')    PainSc: Zero      Body mass index is 25.42 kg/m?Marland Kitchen     Past Medical History  Patient Active Problem List    Diagnosis Date Noted   ? 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 S/P ablation of atrial flutter 02/18/2017   ? Typical atrial flutter (HCC) 02/04/2017     Added automatically from request for surgery [phone]     ? 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 Paroxysmal atrial fibrillation (HCC) 08/18/2015     ~2005 - Isolated episode of PAF  07/2015 - AF episode while in [facility] ED for hypertension  09/11/16 - Rapid AF during treadmill MPI at [facility] study  10/11/17 Successful Paroxysmal atrial fibrillation cryoballoon ablation by Dr. Naoma Diener     ? Hypertension 08/18/2015   ? BPH (benign prostatic hyperplasia) 08/18/2015   ? Anxiety 08/18/2015         Review of Systems   Constitutional: Positive for malaise/fatigue.   HENT: Negative.    Eyes: Positive for blurred vision.   Cardiovascular: Positive for dyspnea on exertion.   Respiratory: Negative.    Endocrine: Negative.    Hematologic/Lymphatic: Negative.    Skin: Positive for skin cancer.   Musculoskeletal: Positive for muscle weakness.   Gastrointestinal: Negative.    Genitourinary: Negative.    Neurological: Positive for dizziness, light-headedness and weakness.   Psychiatric/Behavioral: Negative.    Allergic/Immunologic: Negative.        Physical Exam    Physical Exam   General Appearance: no distress   Skin: warm, no ulcers or xanthomas   Digits and Nails: no cyanosis or clubbing   Eyes: conjunctivae and lids normal, pupils are equal and round   Teeth/Gums/Palate: dentition unremarkable, no lesions   Lips & Oral Mucosa: no pallor or cyanosis   Neck Veins: normal JVP , neck veins are not distended   Thyroid: no nodules, masses, tenderness or enlargement   Chest Inspection: chest is normal in appearance   Respiratory Effort: breathing comfortably, no respiratory distress   Auscultation/Percussion:  lungs clear to auscultation, no rales or rhonchi, no wheezing   PMI: PMI not enlarged or displaced   Cardiac Rhythm: regular rhythm and normal rate   Cardiac Auscultation: S1, S2 normal, no rub, no gallop   Murmurs: no murmur   Peripheral Circulation: normal peripheral circulation   Carotid Arteries: normal carotid upstroke bilaterally, no bruits   Radial Arteries: normal symmetric radial pulses   Abdominal Aorta: no abdominal aortic bruit   Pedal Pulses: normal symmetric pedal pulses   Lower Extremity Edema: no lower extremity edema   Abdominal Exam: soft, non-tender, no masses, bowel sounds normal   Liver & Spleen: no organomegaly   Gait & Station: walks without assistance   Muscle Strength: normal muscle tone   Orientation: oriented to time, place and person   Affect & Mood: appropriate and sustained affect   Language and Memory: patient responsive and seems to comprehend information   Neurologic Exam: neurological assessment grossly intact   Other: moves all extremities      Cardiovascular Health Factors  Vitals BP Readings from Last 3 Encounters:   07/12/20 128/84   01/21/20 (!) 144/86   07/16/19 (!) 140/90     Wt Readings from Last 3 Encounters:   07/12/20 85 kg (187 lb 6.4 oz)   01/21/20 85.7 kg (189 lb)   07/16/19 88.2 kg (194 lb 6.4 oz)     BMI Readings from Last 3 Encounters:   07/12/20 25.42 kg/m?   01/21/20 25.63 kg/m?   07/16/19 26.37 kg/m?      Smoking Social History     Tobacco Use   Smoking Status Never Smoker   Smokeless Tobacco Never Used      Lipid Profile Cholesterol   Date Value Ref Range Status   06/21/2020 203 (H) <200 Final     HDL   Date Value Ref Range Status   06/21/2020 75  Final     LDL   Date Value Ref Range Status   06/21/2020 120 (H) <100 Final     Triglycerides   Date Value Ref Range Status   06/21/2020 42  Final      Blood Sugar No results found for: HGBA1C  Glucose   Date Value Ref Range Status   06/21/2020 86  Final   01/14/2018 98 70 - 100 MG/DL Final   16/04/9603 98  Final   02/04/2017 99 65 - 99 mg/dL Final     Comment:                   Fasting reference interval               Problems Addressed Today  Encounter Diagnoses   Name Primary?   ? Primary hypertension    ? Paroxysmal atrial fibrillation (HCC)    ? Presence of cardiac pacemaker        Assessment and Plan       Hypertension  I told him to take 1/4 teaspoon of salt with some warm water on days that his blood pressure is relatively low.  The only medication that he currently takes that might lower his blood pressure is the Flomax, but he needs this so I think we will just plan to continue with.    Paroxysmal atrial fibrillation (HCC)  His device check shows no recurrence of atrial arrhythmia.  He will plan to continue oral anticoagulation indefinitely.    Presence of cardiac pacemaker  He had an in office device check in June and  has remote transmissions on a quarterly basis.  His device functions normally and he has had no evidence of recurrent atrial arrhythmia.      Current Medications (including today's revisions)  ? apixaban (ELIQUIS) 5 mg tablet Take one tablet by mouth twice daily.   ? aspirin EC 81 mg tablet Take 1 Tab by mouth daily. Take with food.   ? CALCIUM PO Take  by mouth daily.   ? docusate (COLACE) 100 mg capsule Take 100 mg by mouth at bedtime daily.   ? finasteride (PROSCAR) 5 mg tablet Take 5 mg by mouth daily.   ? LUTEIN PO Take 1 tablet by mouth daily.   ? milk of magnesia (CONC) 2,400 mg/10 mL oral suspension Take  by mouth at bedtime daily.   ? tamsulosin (FLOMAX) 0.4 mg capsule Take 0.4 mg by mouth daily. Do not crush, chew or open capsules. Take 30 minutes following the same meal each day.     Total time spent on today's office visit was 30 minutes.  This includes face-to-face in person visit with patient as well as nonface-to-face time including review of the EMR, outside records, labs, radiologic studies, echocardiogram & other cardiovascular studies, formation of treatment plan, after visit summary, future disposition, and lastly on documentation.

## 2020-07-12 NOTE — Patient Instructions
Try a 1/4 teaspoon salt with some water if BP is low  OK to hold Eliquis for the surgical procedure

## 2020-08-23 ENCOUNTER — Encounter: Admit: 2020-08-23 | Discharge: 2020-08-23 | Payer: MEDICARE

## 2020-11-21 ENCOUNTER — Encounter: Admit: 2020-11-21 | Discharge: 2020-11-21 | Payer: MEDICARE

## 2020-11-21 DIAGNOSIS — Z959 Presence of cardiac and vascular implant and graft, unspecified: Secondary | ICD-10-CM

## 2021-01-10 ENCOUNTER — Encounter: Admit: 2021-01-10 | Discharge: 2021-01-10 | Payer: MEDICARE

## 2021-01-19 ENCOUNTER — Encounter: Admit: 2021-01-19 | Discharge: 2021-01-19 | Payer: MEDICARE

## 2021-01-19 ENCOUNTER — Ambulatory Visit: Admit: 2021-01-19 | Discharge: 2021-01-19 | Payer: MEDICARE

## 2021-01-19 DIAGNOSIS — I495 Sick sinus syndrome: Secondary | ICD-10-CM

## 2021-01-19 DIAGNOSIS — I10 Essential (primary) hypertension: Secondary | ICD-10-CM

## 2021-01-19 DIAGNOSIS — I483 Typical atrial flutter: Secondary | ICD-10-CM

## 2021-01-19 DIAGNOSIS — F419 Anxiety disorder, unspecified: Secondary | ICD-10-CM

## 2021-01-19 DIAGNOSIS — C4491 Basal cell carcinoma of skin, unspecified: Secondary | ICD-10-CM

## 2021-01-19 DIAGNOSIS — Z950 Presence of cardiac pacemaker: Secondary | ICD-10-CM

## 2021-01-19 DIAGNOSIS — I4891 Unspecified atrial fibrillation: Secondary | ICD-10-CM

## 2021-01-19 DIAGNOSIS — C801 Malignant (primary) neoplasm, unspecified: Secondary | ICD-10-CM

## 2021-01-19 DIAGNOSIS — H9190 Unspecified hearing loss, unspecified ear: Secondary | ICD-10-CM

## 2021-01-19 DIAGNOSIS — I480 Paroxysmal atrial fibrillation: Secondary | ICD-10-CM

## 2021-01-19 DIAGNOSIS — N400 Enlarged prostate without lower urinary tract symptoms: Secondary | ICD-10-CM

## 2021-01-19 MED ORDER — AMLODIPINE 5 MG PO TAB
2.5 mg | ORAL_TABLET | Freq: Every day | ORAL | 0 refills | Status: AC
Start: 2021-01-19 — End: ?

## 2021-01-19 NOTE — Assessment & Plan Note
Normal pacing system function.

## 2021-01-19 NOTE — Progress Notes
Date of Service: 01/19/2021    Stanley Campbell is a 80 y.o. male.       HPI     Stanley Campbell and his wife were in [place] [facility] today for follow-up regarding paroxysmal atrial fibrillation and pacemaker.  He has been started on Pristiq and it has really helped with his mood and anxiety levels.    He gets a little light headed when working in the heat.  His BP has definitely been lower with less anxiety.  He tries to hydrate better during the summer.    He denies palpitations or chest discomfort.  He hasn't had dyspnea or peripheral edema.         Vitals:    01/19/21 1423 01/19/21 1437   BP: 122/76 116/78   BP Source: Arm, Left Upper Arm, Right Upper   Pulse: 64    SpO2: 99%    O2 Percent: 99 %    O2 Device: None (Room air)    PainSc: Zero    Weight: 84.2 kg (185 lb 9.6 oz)    Height: 182.9 cm (6')      Body mass index is 25.17 kg/m?Marland Kitchen     Past Medical History  Patient Active Problem List    Diagnosis Date Noted   ? 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 S/P ablation of atrial flutter 02/18/2017   ? Typical atrial flutter (HCC) 02/04/2017     Added automatically from request for surgery [id number]     ? 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 Paroxysmal atrial fibrillation (HCC) 08/18/2015     ~2005 - Isolated episode of PAF  07/2015 - AF episode while in [facility] ED for hypertension  09/11/16 - Rapid AF during treadmill MPI at [facility] study  10/11/17 Successful Paroxysmal atrial fibrillation cryoballoon ablation by Dr. Naoma Diener     ? Hypertension 08/18/2015   ? BPH (benign prostatic hyperplasia) 08/18/2015   ? Anxiety 08/18/2015         Review of Systems   Constitutional: Positive for malaise/fatigue.   HENT: Negative.    Eyes: Negative.    Cardiovascular: Positive for dyspnea on exertion.   Respiratory: Negative.    Endocrine: Negative.    Hematologic/Lymphatic: Negative.    Skin: Positive for skin cancer.   Musculoskeletal: Positive for back pain and falls.   Gastrointestinal: Negative.    Genitourinary: Positive for frequency.   Neurological: Positive for dizziness and light-headedness.   Psychiatric/Behavioral: Positive for depression.   Allergic/Immunologic: Negative.        Physical Exam    Physical Exam   General Appearance: no distress   Skin: warm, no ulcers or xanthomas   Digits and Nails: no cyanosis or clubbing   Eyes: conjunctivae and lids normal, pupils are equal and round   Teeth/Gums/Palate: dentition unremarkable, no lesions   Lips & Oral Mucosa: no pallor or cyanosis   Neck Veins: normal JVP , neck veins are not distended   Thyroid: no nodules, masses, tenderness or enlargement   Chest Inspection: chest is normal in appearance   Respiratory Effort: breathing comfortably, no respiratory distress   Auscultation/Percussion: lungs clear to auscultation, no rales or rhonchi, no wheezing   PMI: PMI not enlarged or displaced   Cardiac Rhythm: regular rhythm and normal rate   Cardiac Auscultation: S1, S2 normal, no rub, no gallop   Murmurs: no murmur   Peripheral Circulation: normal peripheral circulation   Carotid  Arteries: normal carotid upstroke bilaterally, no bruits   Radial Arteries: normal symmetric radial pulses   Abdominal Aorta: no abdominal aortic bruit   Pedal Pulses: normal symmetric pedal pulses   Lower Extremity Edema: no lower extremity edema   Abdominal Exam: soft, non-tender, no masses, bowel sounds normal   Liver & Spleen: no organomegaly   Gait & Station: walks without assistance   Muscle Strength: normal muscle tone   Orientation: oriented to time, place and person   Affect & Mood: appropriate and sustained affect   Language and Memory: patient responsive and seems to comprehend information   Neurologic Exam: neurological assessment grossly intact   Other: moves all extremities      Cardiovascular Health Factors  Vitals BP Readings from Last 3 Encounters:   01/19/21 116/78   07/12/20 128/84   01/21/20 (!) 144/86     Wt Readings from Last 3 Encounters:   01/19/21 84.2 kg (185 lb 9.6 oz)   07/12/20 85 kg (187 lb 6.4 oz)   01/21/20 85.7 kg (189 lb)     BMI Readings from Last 3 Encounters:   01/19/21 25.17 kg/m?   07/12/20 25.42 kg/m?   01/21/20 25.63 kg/m?      Smoking Social History     Tobacco Use   Smoking Status Never Smoker   Smokeless Tobacco Never Used      Lipid Profile Cholesterol   Date Value Ref Range Status   06/21/2020 203 (H) <200 Final     HDL   Date Value Ref Range Status   06/21/2020 75  Final     LDL   Date Value Ref Range Status   06/21/2020 120 (H) <100 Final     Triglycerides   Date Value Ref Range Status   06/21/2020 42  Final      Blood Sugar No results found for: HGBA1C  Glucose   Date Value Ref Range Status   07/27/2020 89  Final   06/21/2020 86  Final   01/14/2018 98 70 - 100 MG/DL Final   09/81/1914 99 65 - 99 mg/dL Final     Comment:                   Fasting reference interval               Problems Addressed Today  Encounter Diagnoses   Name Primary?   ? Primary hypertension    ? Paroxysmal atrial fibrillation (HCC)    ? Presence of cardiac pacemaker        Assessment and Plan       Hypertension  Reduce amlodipine to 2.5 mg/day.  I also recommended 1/4 tsp salt in a glass of water every morning when it's hot out.    Paroxysmal atrial fibrillation (HCC)  He's had a good result after the ablation procedure.  He no longer requires anti-arrhythmic medications but is still on Eliquis.  No significant bleeding problems.    Presence of cardiac pacemaker  Normal pacing system function.      Current Medications (including today's revisions)  ? amLODIPine (NORVASC) 5 mg tablet Take one-half tablet by mouth daily.   ? apixaban (ELIQUIS) 5 mg tablet Take one tablet by mouth twice daily.   ? aspirin EC 81 mg tablet Take 1 Tab by mouth daily. Take with food.   ? CALCIUM PO Take  by mouth daily.   ? desvenlafaxine succinate (PRISTIQ) 50 mg tablet Take 2 tablets by mouth daily.   ?  docusate (COLACE) 100 mg capsule Take 100 mg by mouth at bedtime daily.   ? finasteride (PROSCAR) 5 mg tablet Take 5 mg by mouth daily.   ? LORazepam (ATIVAN) 1 mg tablet Take 1 tablet by mouth as Needed.   ? LUTEIN PO Take 1 tablet by mouth daily.   ? metoprolol tartrate 25 mg tablet Take 1 tablet by mouth twice daily.   ? milk of magnesia (CONC) 2,400 mg/10 mL oral suspension Take  by mouth at bedtime daily.   ? tamsulosin (FLOMAX) 0.4 mg capsule Take 0.4 mg by mouth daily. Do not crush, chew or open capsules. Take 30 minutes following the same meal each day.     Total time spent on today's office visit was 25 minutes.  This includes face-to-face in person visit with patient as well as nonface-to-face time including review of the EMR, outside records, labs, radiologic studies, echocardiogram & other cardiovascular studies, formation of treatment plan, after visit summary, future disposition, and lastly on documentation.

## 2021-01-19 NOTE — Assessment & Plan Note
He's had a good result after the ablation procedure.  He no longer requires anti-arrhythmic medications but is still on Eliquis.  No significant bleeding problems.

## 2021-01-19 NOTE — Assessment & Plan Note
Reduce amlodipine to 2.5 mg/day.  I also recommended 1/4 tsp salt in a glass of water every morning when it's hot out.

## 2021-02-09 ENCOUNTER — Encounter: Admit: 2021-02-09 | Discharge: 2021-02-09 | Payer: MEDICARE

## 2021-02-09 NOTE — Telephone Encounter
Received notification that  [employer] Carelink has not been connected since 01/24/21. Patient was instructed to look at his/her transmitter to make sure that it is plugged into power and send a manual transmission to reconnect the transmitter. If he/she has any questions about how to send a transmission or if the transmitter does not appear to be working properly, they need to contact the device company directly. Patient was provided with that contact number. Requested the patient send [place] a MyChart message or contact our device nurses at [phone] to let us know after they have sent their transmission. Placed call to preferred phone number, got no answer, LVM.   CDJ       Note: Patient needs to send a manual remote interrogation to reestablish communication to his/her remote transmitter.

## 2021-03-07 ENCOUNTER — Encounter: Admit: 2021-03-07 | Discharge: 2021-03-07 | Payer: MEDICARE

## 2021-03-07 NOTE — Telephone Encounter
Patient was scheduled for a [employer] Carelink on 03/03/21 that has not been received. Patient was instructed to send a manual transmission. Instructed if the transmitter does not appear to be working properly, they need to contact the device company directly. Patient was provided with that contact number. Requested the patient send [place] a MyChart message or contact our device nurses at [phone] to let us know after they have sent their transmission.  Placed call to preferred phone number, got no answer LVM   CDJ     Note: Patient needs to send a manual transmission as we did not receive their scheduled remote interrogation.

## 2021-03-21 ENCOUNTER — Encounter: Admit: 2021-03-21 | Discharge: 2021-03-21 | Payer: MEDICARE

## 2021-06-21 ENCOUNTER — Encounter: Admit: 2021-06-21 | Discharge: 2021-06-21 | Payer: MEDICARE

## 2021-09-14 ENCOUNTER — Encounter: Admit: 2021-09-14 | Discharge: 2021-09-14 | Payer: MEDICARE

## 2021-09-18 ENCOUNTER — Encounter: Admit: 2021-09-18 | Discharge: 2021-09-18 | Payer: MEDICARE

## 2021-09-19 ENCOUNTER — Encounter: Admit: 2021-09-19 | Discharge: 2021-09-19 | Payer: MEDICARE

## 2021-09-19 ENCOUNTER — Ambulatory Visit: Admit: 2021-09-19 | Discharge: 2021-09-20 | Payer: MEDICARE

## 2021-09-19 DIAGNOSIS — I480 Paroxysmal atrial fibrillation: Secondary | ICD-10-CM

## 2021-09-19 DIAGNOSIS — C4491 Basal cell carcinoma of skin, unspecified: Secondary | ICD-10-CM

## 2021-09-19 DIAGNOSIS — N400 Enlarged prostate without lower urinary tract symptoms: Secondary | ICD-10-CM

## 2021-09-19 DIAGNOSIS — I483 Typical atrial flutter: Secondary | ICD-10-CM

## 2021-09-19 DIAGNOSIS — F419 Anxiety disorder, unspecified: Secondary | ICD-10-CM

## 2021-09-19 DIAGNOSIS — I495 Sick sinus syndrome: Secondary | ICD-10-CM

## 2021-09-19 DIAGNOSIS — H9190 Unspecified hearing loss, unspecified ear: Secondary | ICD-10-CM

## 2021-09-19 DIAGNOSIS — I4891 Unspecified atrial fibrillation: Secondary | ICD-10-CM

## 2021-09-19 DIAGNOSIS — I10 Essential (primary) hypertension: Secondary | ICD-10-CM

## 2021-09-19 DIAGNOSIS — Z950 Presence of cardiac pacemaker: Secondary | ICD-10-CM

## 2021-09-19 DIAGNOSIS — C801 Malignant (primary) neoplasm, unspecified: Secondary | ICD-10-CM

## 2021-09-19 NOTE — Progress Notes
Date of Service: 09/19/2021    Stanley Campbell is a 81 y.o. male.       HPI     Stanley Campbell was in the [place] clinic today for follow-up regarding his pacemaker and history of paroxysmal atrial fibrillation.  His daughter, Stanley Campbell, is one of the ICU nurses here at [facility].    He and his wife have really enjoyed travel over the years but have not been able to do much over the past 3 years because of the pandemic.  They did get to go down to [place] just a couple of weeks ago and had a great trip.    From a cardiac standpoint he really seems to be doing well.  His blood pressure averages about 120/80.  He still occasionally has days where his blood pressure is a little bit low but his wife says that he never does hydrate very adequately.    He is not having any palpitations or other symptoms to suggest recurrent atrial fibrillation.  He denies any TIA or stroke symptoms and he has had no bleeding complications related to oral anticoagulation.         Vitals:    09/19/21 1419   BP: 128/78   BP Source: Arm, Left Upper   Pulse: 73   SpO2: 97%   O2 Device: None (Room air)   PainSc: Zero   Weight: 93.3 kg (205 lb 9.6 oz)   Height: 182.9 cm (6')     Body mass index is 27.88 kg/m?Marland Kitchen     Past Medical History  Patient Active Problem List    Diagnosis Date Noted   ? 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 S/P ablation of atrial flutter 02/18/2017   ? 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 Paroxysmal atrial fibrillation (HCC) 08/18/2015     ~2005 - Isolated episode of PAF  07/2015 - AF episode while in [facility] ED for hypertension  09/11/16 - Rapid AF during treadmill MPI at [facility] study  10/11/17 Successful Paroxysmal atrial fibrillation cryoballoon ablation by Dr. Naoma Diener     ? Hypertension 08/18/2015   ? BPH (benign prostatic hyperplasia) 08/18/2015   ? Anxiety 08/18/2015         Review of Systems   Constitutional: Negative.   HENT: Negative.    Eyes: Negative.    Cardiovascular: Negative.    Respiratory: Negative.    Endocrine: Negative.    Hematologic/Lymphatic: Negative.    Skin: Negative.    Musculoskeletal: Negative.    Gastrointestinal: Negative.    Genitourinary: Negative.    Neurological: Negative.    Psychiatric/Behavioral: Negative.    Allergic/Immunologic: Negative.        Physical Exam    Physical Exam   General Appearance: no distress   Skin: warm, no ulcers or xanthomas   Digits and Nails: no cyanosis or clubbing   Eyes: conjunctivae and lids normal, pupils are equal and round   Teeth/Gums/Palate: dentition unremarkable, no lesions   Lips & Oral Mucosa: no pallor or cyanosis   Neck Veins: normal JVP , neck veins are not distended   Thyroid: no nodules, masses, tenderness or enlargement   Chest Inspection: chest is normal in appearance   Respiratory Effort: breathing comfortably, no respiratory distress   Auscultation/Percussion: lungs clear to auscultation, no rales or  rhonchi, no wheezing   PMI: PMI not enlarged or displaced   Cardiac Rhythm: regular rhythm and normal rate   Cardiac Auscultation: S1, S2 normal, no rub, no gallop   Murmurs: no murmur   Peripheral Circulation: normal peripheral circulation   Carotid Arteries: normal carotid upstroke bilaterally, no bruits   Radial Arteries: normal symmetric radial pulses   Abdominal Aorta: no abdominal aortic bruit   Pedal Pulses: normal symmetric pedal pulses   Lower Extremity Edema: no lower extremity edema   Abdominal Exam: soft, non-tender, no masses, bowel sounds normal   Liver & Spleen: no organomegaly   Gait & Station: walks without assistance   Muscle Strength: normal muscle tone   Orientation: oriented to time, place and person   Affect & Mood: appropriate and sustained affect   Language and Memory: patient responsive and seems to comprehend information   Neurologic Exam: neurological assessment grossly intact   Other: moves all extremities      Cardiovascular Health Factors  Vitals BP Readings from Last 3 Encounters:   09/19/21 128/78   01/19/21 116/78   07/12/20 128/84     Wt Readings from Last 3 Encounters:   09/19/21 93.3 kg (205 lb 9.6 oz)   01/19/21 84.2 kg (185 lb 9.6 oz)   07/12/20 85 kg (187 lb 6.4 oz)     BMI Readings from Last 3 Encounters:   09/19/21 27.88 kg/m?   01/19/21 25.17 kg/m?   07/12/20 25.42 kg/m?      Smoking Social History     Tobacco Use   Smoking Status Never   Smokeless Tobacco Never      Lipid Profile Cholesterol   Date Value Ref Range Status   04/27/2021 206 (H) <200 Final     HDL   Date Value Ref Range Status   04/27/2021 78  Final     LDL   Date Value Ref Range Status   04/27/2021 121 (H) <100 Final     Triglycerides   Date Value Ref Range Status   04/27/2021 36  Final      Blood Sugar No results found for: HGBA1C  Glucose   Date Value Ref Range Status   04/27/2021 95  Final   07/27/2020 89  Final   06/21/2020 86  Final   02/04/2017 99 65 - 99 mg/dL Final     Comment:                   Fasting reference interval               Problems Addressed Today  Encounter Diagnoses   Name Primary?   ? Paroxysmal atrial fibrillation (HCC)    ? Primary hypertension    ? Presence of cardiac pacemaker        Assessment and Plan       Hypertension  He is treated to goal, average blood pressure less than 130/80.    Paroxysmal atrial fibrillation (HCC)  He had an ablation about 4 years ago and has really not had significant recurrence of atrial arrhythmias since then.    Presence of cardiac pacemaker  He had a remote transmission yesterday that showed normal device function.      Current Medications (including today's revisions)  ? amLODIPine (NORVASC) 5 mg tablet Take one-half tablet by mouth daily.   ? apixaban (ELIQUIS) 5 mg tablet Take one tablet by mouth twice daily.   ? CALCIUM PO Take  by mouth daily.   ?  desvenlafaxine succinate (PRISTIQ) 50 mg tablet Take two tablets by mouth daily.   ? docusate (COLACE) 100 mg capsule Take one capsule by mouth at bedtime daily.   ? finasteride (PROSCAR) 5 mg tablet Take one tablet by mouth daily.   ? LORazepam (ATIVAN) 1 mg tablet Take one tablet by mouth as Needed.   ? LUTEIN PO Take 1 tablet by mouth daily.   ? metoprolol tartrate 25 mg tablet Take one tablet by mouth twice daily.   ? milk of magnesia (CONC) 2,400 mg/10 mL oral suspension Take  by mouth at bedtime daily.   ? tamsulosin (FLOMAX) 0.4 mg capsule Take one capsule by mouth daily. Do not crush, chew or open capsules. Take 30 minutes following the same meal each day.     Total time spent on today's office visit was 30 minutes.  This includes face-to-face in person visit with patient as well as nonface-to-face time including review of the EMR, outside records, labs, radiologic studies, echocardiogram & other cardiovascular studies, formation of treatment plan, after visit summary, future disposition, and lastly on documentation.

## 2021-09-19 NOTE — Assessment & Plan Note
He had a remote transmission yesterday that showed normal device function.

## 2021-09-19 NOTE — Assessment & Plan Note
He is treated to goal, average blood pressure less than 130/80.

## 2021-09-19 NOTE — Assessment & Plan Note
He had an ablation about 4 years ago and has really not had significant recurrence of atrial arrhythmias since then.

## 2021-12-28 ENCOUNTER — Encounter: Admit: 2021-12-28 | Discharge: 2021-12-28 | Payer: MEDICARE

## 2021-12-28 DIAGNOSIS — Z950 Presence of cardiac pacemaker: Secondary | ICD-10-CM

## 2021-12-28 NOTE — Telephone Encounter
Patient denies issues.  States doing well.  Will route to [facility] for recommendations.

## 2021-12-28 NOTE — Telephone Encounter
-----   Message from Nichola Sizer, RN sent at 12/28/2021 12:13 PM CDT -----  Regarding: FW: SDO Patient NSVT Event With Pacemaker    ----- Message -----  From: [place] Mode  Sent: 12/28/2021  11:55 AM CDT  To: Cvm Nurse Gen Card Team Red  Subject: SDO Patient NSVT Event With Pacemaker            Good Morning,    Patient recent remote transmission recorded 1 event with EGMs suggestive of NSVT. Patient has pacemaker. NSVT not list on problems list in Epic.    Total episodes: 1  Episode date: 11/03/21  Episode length: 13 beats just above, at, and below detection zone (~ 5 seconds)  Avg V rate: 221bpm    Please reveiew PPM Note from 12/28/21 for further details.    Thank you,    Paden

## 2021-12-28 NOTE — Telephone Encounter
-----   Message from Cindie Laroche, RN sent at 12/28/2021  3:35 PM CDT -----  Regarding: FW: SDO Patient NSVT Event With Pacemaker    ----- Message -----  From: Nichola Sizer, RN  Sent: 12/28/2021  12:13 PM CDT  To: Vickie Epley, RN  Subject: FW: SDO Patient NSVT Event With Pacemaker          ----- Message -----  From: [place] Mode  Sent: 12/28/2021  11:55 AM CDT  To: Cvm Nurse Gen Card Team Red  Subject: SDO Patient NSVT Event With Pacemaker            Good Morning,    Patient recent remote transmission recorded 1 event with EGMs suggestive of NSVT. Patient has pacemaker. NSVT not list on problems list in Epic.    Total episodes: 1  Episode date: 11/03/21  Episode length: 13 beats just above, at, and below detection zone (~ 5 seconds)  Avg V rate: 221bpm    Please reveiew PPM Note from 12/28/21 for further details.    Thank you,    Paden

## 2022-03-21 ENCOUNTER — Encounter: Admit: 2022-03-21 | Discharge: 2022-03-21 | Payer: MEDICARE

## 2022-03-21 DIAGNOSIS — I483 Typical atrial flutter: Secondary | ICD-10-CM

## 2022-03-21 DIAGNOSIS — Z9889 Other specified postprocedural states: Secondary | ICD-10-CM

## 2022-03-21 DIAGNOSIS — I480 Paroxysmal atrial fibrillation: Secondary | ICD-10-CM

## 2022-03-21 DIAGNOSIS — Z950 Presence of cardiac pacemaker: Secondary | ICD-10-CM

## 2022-03-21 NOTE — Telephone Encounter
-----   Message from Vickie Epley, RN sent at 03/21/2022  9:24 AM CDT -----  Regarding: FW: device fu    ----- Message -----  From: Vernell Morgans, RN  Sent: 03/21/2022   9:10 AM CDT  To: Vickie Epley, RN  Subject: FW: device fu                                      ----- Message -----  From: Mercy Riding  Sent: 03/21/2022   9:05 AM CDT  To: Cvm Nurse Gen Card Team Red  Subject: device fu                                        Pt is overdue for an in office device check.

## 2022-04-18 ENCOUNTER — Encounter: Admit: 2022-04-18 | Discharge: 2022-04-18 | Payer: MEDICARE

## 2022-04-18 ENCOUNTER — Ambulatory Visit: Admit: 2022-04-18 | Discharge: 2022-04-18 | Payer: MEDICARE

## 2022-04-18 DIAGNOSIS — Z950 Presence of cardiac pacemaker: Secondary | ICD-10-CM

## 2022-04-18 DIAGNOSIS — Z9889 Other specified postprocedural states: Secondary | ICD-10-CM

## 2022-04-18 DIAGNOSIS — I480 Paroxysmal atrial fibrillation: Secondary | ICD-10-CM

## 2022-04-18 DIAGNOSIS — I483 Typical atrial flutter: Secondary | ICD-10-CM

## 2022-06-15 ENCOUNTER — Encounter: Admit: 2022-06-15 | Discharge: 2022-06-15 | Payer: MEDICARE

## 2022-06-21 ENCOUNTER — Encounter: Admit: 2022-06-21 | Discharge: 2022-06-21 | Payer: MEDICARE

## 2022-09-21 ENCOUNTER — Encounter: Admit: 2022-09-21 | Discharge: 2022-09-21 | Payer: MEDICARE

## 2022-09-21 IMAGING — CR [ID]
2 series · 2 of 2 positions shown · non-contrast
Comparison: none

[knee sunrise]
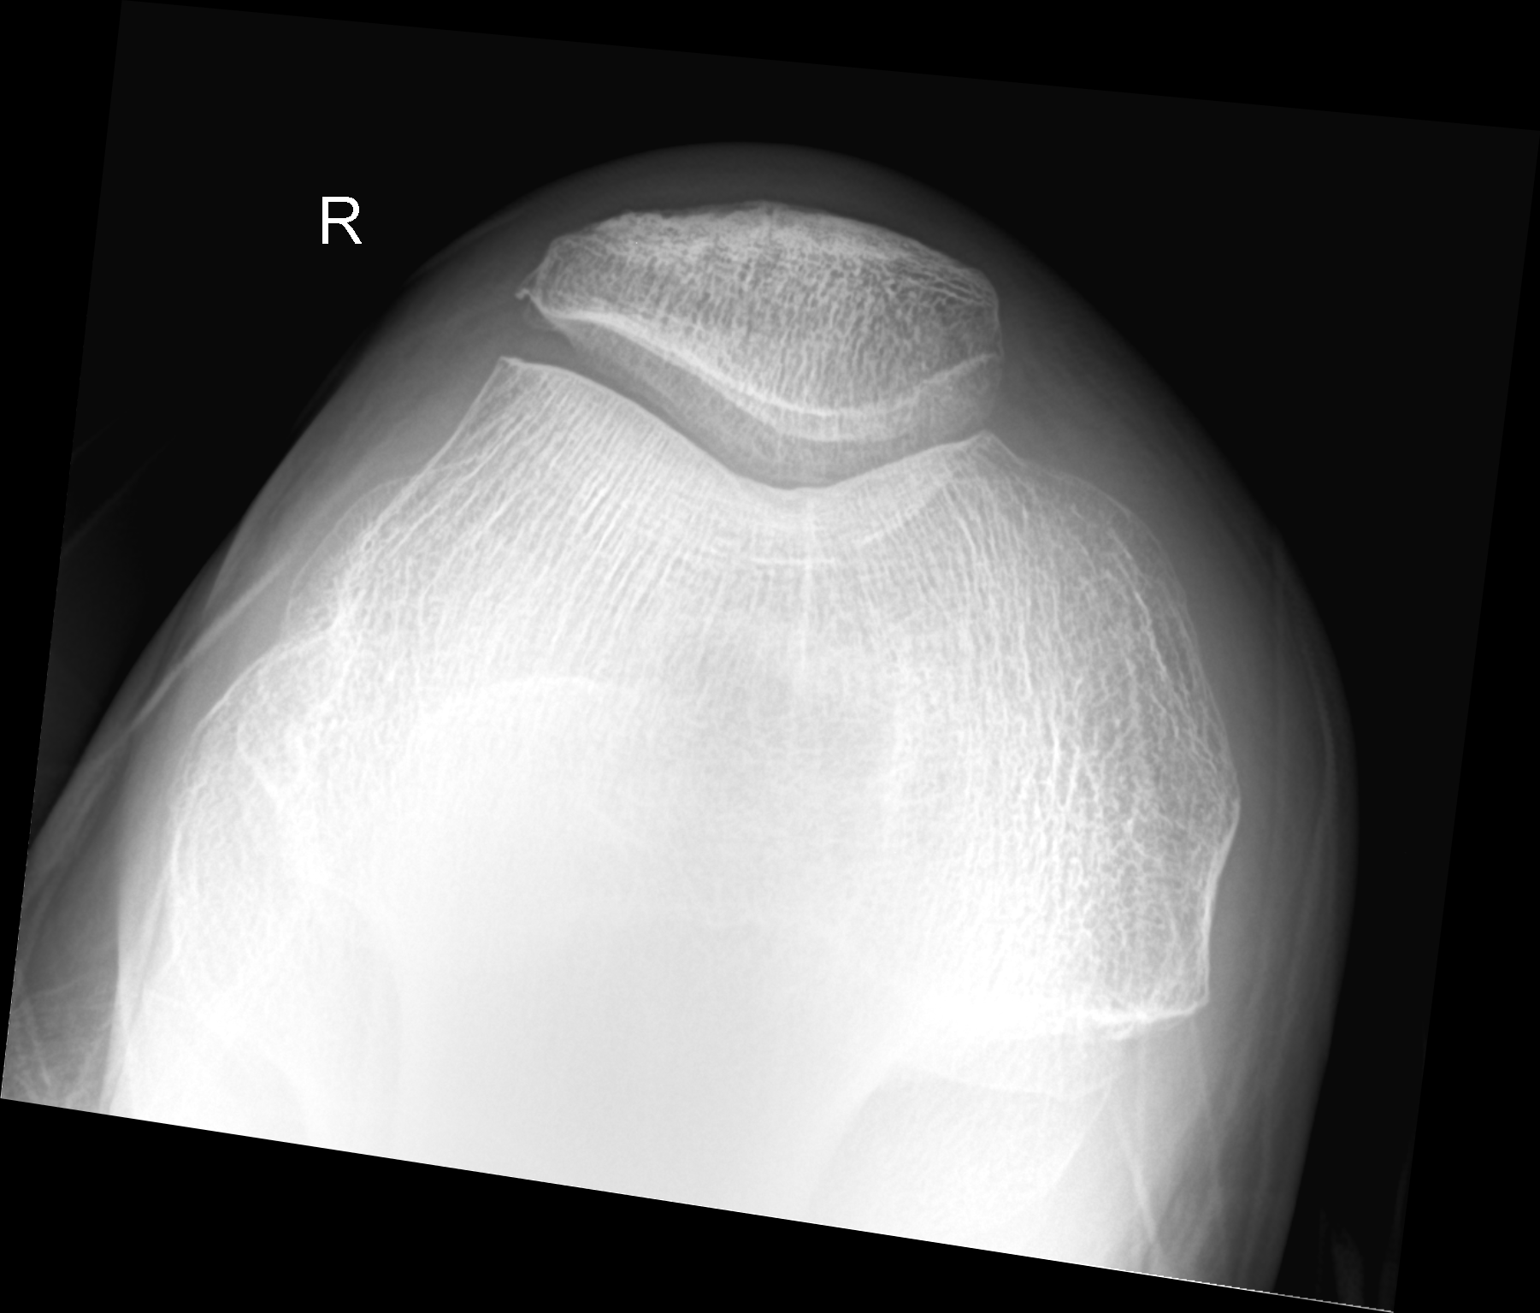

[knee lat]
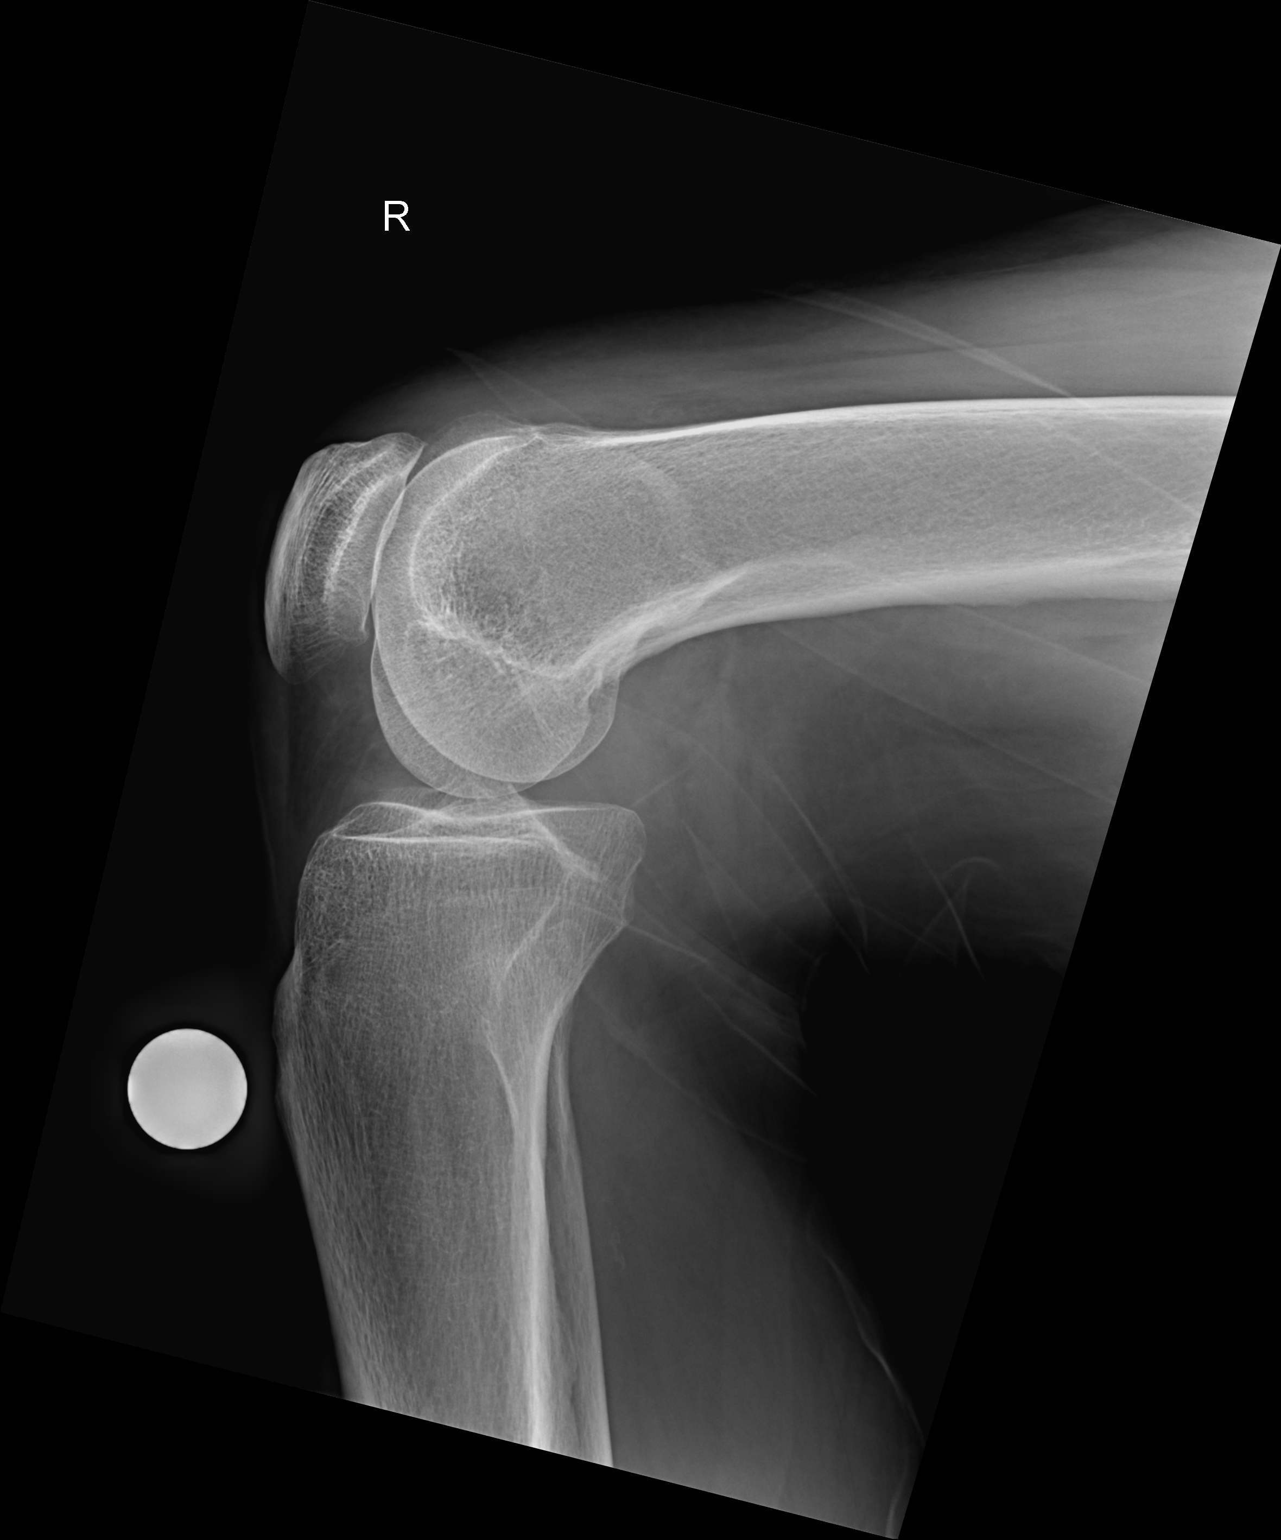

[2 of 2 positions shown; findings below may reference images not displayed]

DIAGNOSTIC STUDIES

EXAM

XR knee RT 3V

INDICATION

Knee pain
rt knee pain, pt states no trauma, no surgical hx

TECHNIQUE

AP lateral and patellar views

COMPARISONS

March 25, 2018

FINDINGS

Slight lateral compartment narrowing is noted with mild osteophytic spurring. There is minimal
spurring off the articular facets of the patella. No fractures are seen.

IMPRESSION

Mild degenerative changes.

Tech Notes:

rt knee pain, pt states no trauma, no surgical hx

## 2022-09-25 ENCOUNTER — Encounter: Admit: 2022-09-25 | Discharge: 2022-09-25 | Payer: MEDICARE

## 2022-09-26 ENCOUNTER — Encounter: Admit: 2022-09-26 | Discharge: 2022-09-26 | Payer: MEDICARE

## 2022-09-28 ENCOUNTER — Encounter: Admit: 2022-09-28 | Discharge: 2022-09-28 | Payer: MEDICARE

## 2022-10-09 ENCOUNTER — Encounter: Admit: 2022-10-09 | Discharge: 2022-10-09 | Payer: MEDICARE

## 2022-10-09 ENCOUNTER — Ambulatory Visit: Admit: 2022-10-09 | Discharge: 2022-10-10 | Payer: MEDICARE

## 2022-10-09 DIAGNOSIS — I10 Essential (primary) hypertension: Secondary | ICD-10-CM

## 2022-10-09 DIAGNOSIS — I480 Paroxysmal atrial fibrillation: Secondary | ICD-10-CM

## 2022-10-09 DIAGNOSIS — F419 Anxiety disorder, unspecified: Secondary | ICD-10-CM

## 2022-10-09 DIAGNOSIS — Z9889 Other specified postprocedural states: Secondary | ICD-10-CM

## 2022-10-09 DIAGNOSIS — G4730 Sleep apnea, unspecified: Secondary | ICD-10-CM

## 2022-10-09 DIAGNOSIS — H9190 Unspecified hearing loss, unspecified ear: Secondary | ICD-10-CM

## 2022-10-09 DIAGNOSIS — C4491 Basal cell carcinoma of skin, unspecified: Secondary | ICD-10-CM

## 2022-10-09 DIAGNOSIS — I4891 Unspecified atrial fibrillation: Secondary | ICD-10-CM

## 2022-10-09 DIAGNOSIS — I495 Sick sinus syndrome: Secondary | ICD-10-CM

## 2022-10-09 DIAGNOSIS — N400 Enlarged prostate without lower urinary tract symptoms: Secondary | ICD-10-CM

## 2022-10-09 DIAGNOSIS — C801 Malignant (primary) neoplasm, unspecified: Secondary | ICD-10-CM

## 2022-10-09 DIAGNOSIS — I483 Typical atrial flutter: Secondary | ICD-10-CM

## 2022-10-09 DIAGNOSIS — Z950 Presence of cardiac pacemaker: Secondary | ICD-10-CM

## 2022-10-09 NOTE — Assessment & Plan Note
He has had a good result from his pulmonary vein isolation back in 2019.  His pacemaker has not determined any recurrence of atrial fibrillation.    I talked to the patient and his wife today about the possibility of left atrial appendage occlusion and I think they are very interested.  We will make an appointment with one of my structural heart Associates for initial consultation.    Shared Decision-Making interaction regarding the use of anticoagulation in patient with non-valvular Atrial Fibrillation    NYHA Class:   II    HAS-BLED score: HTN (1), Bleeding History (1), and Elderly >65 (1)  HAS-BLED Yearly Risk: 3=3.74%    CHA2DS2-VASc Score: HTN (1) and >_ 75 (2)  CHA2DSVASc Yearly Stroke Risk: 2=2.2%    LAAO Indication: History of Bleeding or increased risk of bleeding    Based on their past-history it has been determined that they are poor candidates for long-term oral-anticoagulation, however, may be tolerant of short-term treatment with warfarin or other direct acting oral anticoagulants as necessary.     We have discussed their unique stroke and bleeding risk both on and off oral-anticoagulation, and the rationale for this referral for transcatheter left atrial appendage closure. An evidence-based tool (EBT) was provided to patient to ensure that the patients' health goals and preferences were covered.

## 2022-10-09 NOTE — Progress Notes
Date of Service: 10/09/2022    Stanley Campbell is a 82 y.o. male.       HPI     Stanley Campbell was in the [place] clinic today with his wife.  Their daughter, Stanley Campbell, is one of the ICU nurses here at [facility].  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  He's working with Dr. Doran Heater on his sleep disorder.  His wife is working with him on treatments and she's a great caregiver for him.    With all the flailing at night he's bleeding a lot and his wife actually has to change the bedsheets because they get bloodied!      He's not having any cardiac symptoms--no chest pain, dyspnea, or palpitations.  He denies any TIA or stroke symptoms.       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   HENT: Negative.     Eyes: Negative.    Cardiovascular:  Positive for dyspnea on exertion.   Respiratory: Negative.     Endocrine: Negative.    Hematologic/Lymphatic: Negative.    Skin: Negative.    Musculoskeletal:  Positive for falls, muscle cramps, muscle weakness and myalgias.   Gastrointestinal: Negative.    Genitourinary: Negative.    Neurological:  Positive for weakness.   Psychiatric/Behavioral: Negative.     Allergic/Immunologic: Negative.        Physical Exam    Physical Exam   General Appearance: no distress   Skin: warm, no ulcers or xanthomas   Digits and Nails: no cyanosis or clubbing   Eyes: conjunctivae and lids normal, pupils are equal and round   Teeth/Gums/Palate: dentition unremarkable, no lesions   Lips & Oral Mucosa: no pallor or cyanosis   Neck Veins: normal JVP , neck veins are not distended   Thyroid: no nodules, masses, tenderness or enlargement   Chest Inspection: chest is normal in appearance   Respiratory Effort: breathing comfortably, no respiratory distress   Auscultation/Percussion: lungs clear to auscultation, no rales or rhonchi, no wheezing   PMI: PMI not enlarged or displaced   Cardiac Rhythm: regular rhythm and normal rate   Cardiac Auscultation: S1, S2 normal, no rub, no gallop   Murmurs: no murmur   Peripheral Circulation: normal peripheral circulation   Carotid Arteries: normal carotid upstroke bilaterally, no bruits   Radial Arteries: normal symmetric radial pulses   Abdominal Aorta: no abdominal aortic bruit   Pedal Pulses: normal symmetric pedal pulses   Lower Extremity Edema: no lower extremity edema   Abdominal Exam: soft, non-tender, no masses, bowel sounds normal   Liver & Spleen: no organomegaly   Gait & Station: walks without assistance   Muscle  Strength: normal muscle tone   Orientation: oriented to time, place and person   Affect & Mood: appropriate and sustained affect   Language and Memory: patient responsive and seems to comprehend information   Neurologic Exam: neurological assessment grossly intact   Other: moves all extremities      Cardiovascular Health Factors  Vitals BP Readings from Last 3 Encounters:   10/09/22 (!) 148/86   09/19/21 128/78   01/19/21 116/78     Wt Readings from Last 3 Encounters:   10/09/22 92.2 kg (203 lb 3.2 oz)   09/19/21 93.3 kg (205 lb 9.6 oz)   01/19/21 84.2 kg (185 lb 9.6 oz)     BMI Readings from Last 3 Encounters:   10/09/22 27.56 kg/m?   09/19/21 27.88 kg/m?   01/19/21 25.17 kg/m?      Smoking Social History     Tobacco Use   Smoking Status Never   Smokeless Tobacco Never      Lipid Profile Cholesterol   Date Value Ref Range Status   07/26/2022 206 (H) <200 Final     HDL   Date Value Ref Range Status   07/26/2022 76  Final     LDL   Date Value Ref Range Status   07/26/2022 121 (H) <100 Final     Triglycerides   Date Value Ref Range Status   07/26/2022 48  Final      Blood Sugar No results found for: HGBA1C  Glucose   Date Value Ref Range Status   07/26/2022 94  Final   04/27/2021 95  Final   07/27/2020 89  Final   02/04/2017 99 65 - 99 mg/dL Final     Comment:                   Fasting reference interval               Problems Addressed Today  Encounter Diagnoses   Name Primary?    S/P ablation of atrial flutter Yes    Benign prostatic hyperplasia without lower urinary tract symptoms     Primary hypertension     Paroxysmal atrial fibrillation (HCC)        Assessment and Plan       Atrial fibrillation (HCC)  He has had a good result from his pulmonary vein isolation back in 2019.  His pacemaker has not determined any recurrence of atrial fibrillation.    I talked to the patient and his wife today about the possibility of left atrial appendage occlusion and I think they are very interested.  We will make an appointment with one of my structural heart Associates for initial consultation.    Shared Decision-Making interaction regarding the use of anticoagulation in patient with non-valvular Atrial Fibrillation    NYHA Class:   II    HAS-BLED score: HTN (1), Bleeding History (1), and Elderly >65 (1)  HAS-BLED Yearly Risk: 3=3.74%    CHA2DS2-VASc Score: HTN (1) and >_ 75 (2)  CHA2DSVASc Yearly Stroke Risk: 2=2.2%    LAAO Indication: History of Bleeding or increased risk of bleeding    Based on their past-history it has been determined that they are poor candidates for long-term oral-anticoagulation, however, may be tolerant of short-term treatment with warfarin or other direct acting oral anticoagulants as necessary.     We have discussed their unique stroke and bleeding risk both on and off oral-anticoagulation, and the rationale for this referral for transcatheter left atrial appendage closure.  An evidence-based tool (EBT) was provided to patient to ensure that the patients' health goals and preferences were covered.            Hypertension  Blood pressure goal is 130/80 or less.  His blood pressure at home tends to be normal.      Current Medications (including today's revisions)   5-hydroxytryptophan (5-HTP) (5-HTP PO) Take  by mouth at bedtime daily.    amLODIPine (NORVASC) 5 mg tablet Take one-half tablet by mouth daily.    apixaban (ELIQUIS) 5 mg tablet Take one tablet by mouth twice daily.    ascorbic acid (vitamin C) (VITAMIN C) 1,000 mg tablet Take one tablet by mouth daily.    BIOTIN PO Take  by mouth daily.    CALCIUM PO Take  by mouth daily.    choline salicyl/mag salicylate (CHOLINE,MAGNESIUM SALICYLATE PO) Take  by mouth daily.    desvenlafaxine (KHEDEZLA) 100 mg ER tablet Take one tablet by mouth daily.    docusate (COLACE) 100 mg capsule Take one capsule by mouth at bedtime daily.    finasteride (PROSCAR) 5 mg tablet Take one tablet by mouth daily.    glucos sul 2KCl/msm/chond/C/Mn (GLUCOSAMINE CHONDROITIN PO) Take  by mouth daily.    LUTEIN PO Take 1 tablet by mouth daily.    metoprolol tartrate 25 mg tablet Take one tablet by mouth twice daily. Patient has been taking 0.5 tablet in the morning and 1 tablet in the evening    milk of magnesia (CONC) 2,400 mg/10 mL oral suspension Take  by mouth at bedtime daily.    SAW PALMETTO PO Take  by mouth daily.    tamsulosin (FLOMAX) 0.4 mg capsule Take one capsule by mouth daily. Do not crush, chew or open capsules. Take 30 minutes following the same meal each day.     Total time spent on today's office visit was 45 minutes.  This includes face-to-face in person visit with patient as well as nonface-to-face time including review of the EMR, outside records, labs, radiologic studies, echocardiogram & other cardiovascular studies, formation of treatment plan, after visit summary, future disposition, and lastly on documentation.

## 2022-10-09 NOTE — Assessment & Plan Note
Blood pressure goal is 130/80 or less.  His blood pressure at home tends to be normal.

## 2022-10-09 NOTE — Assessment & Plan Note
He has had an excellent result from his ablation procedure several years ago.

## 2022-10-16 ENCOUNTER — Encounter: Admit: 2022-10-16 | Discharge: 2022-10-16 | Payer: MEDICARE

## 2022-10-16 NOTE — Progress Notes
Cardiac Navigation Intake Assessment Document      LAAO Operator:  Dr. Riley Nearing    Patient Name:  Stanley Campbell  MRN:  [id number]  DOB:  12-07-1940  Insurance:   Payor: MEDICARE / Plan: MEDICARE PART A AND B / Product Type: Medicare /   Primary contact for patient: wife, Arts development officer Info:   Future Appointments   Date Time Provider Department Center   10/31/2022 10:00 AM Julienne Kass, MD [facility] Exam       Diagnosis and Reason for Visit:  Afib/Watchman  Falls  Bleeding    Physician Info:   Referring Provider:   Dr. Barry Dienes  Shared Decision Making plan:  completed 10/09/2022 by Dr. Barry Dienes  Cardiologist:   Dr. Barry Dienes  EP:   Dr. Naoma Diener (last visit 2019)  PCP:   Dr. Andreas Newport  GI:   n/a  Neuro:   n/a  Other:   n/a    LAAO Indication:   History of Bleeding or increased risk of bleeding and High risk of recurrent falls    CHA2DS2VASc Score:     HTN (1) and >_ 75 (2)  CHA2DS2VASc Yearly Stroke Risk :    3=3.2%    HAS-BLED Score:    HTN (1), Bleeding History (1), and Elderly >65 (1)  HAS-BLED Yearly Risk:    3=3.74%    LAAO Exclusion Criteria:   na/    TEE Contraindications:  None per patient report    Current Anticoagulation:   Eliquis (Apixaban)    Current Antiplatelet:   None    Able to take Aspirin:  YES  Able to take Plavix:  YES  Able to take short term Warfarin:  YES    History of Present Illness:      Atrial Fibrillation Classification: unspecified      AF Treatment Ablation         Device PPM                NYHA Class:   II    Obstructive Sleep Apnea:  NO  Treatment:   n/a      Medical history (pertinent to LAAO workup) :  Afib, Aflutter, HTN, Falls, Weakness, Active dreams w/waking up bloody      Surgery/Procedure history (pertinent to LAAO workup):  Ablation, PPM      Last Echo (date/location):  02/21/2017 [place]    Last TEE (date/location):  10/11/2017 [place]    Last CCTA (date/location):  01/14/2018 [place]        NEEDS Assessment:                             Social Work/Financial:  No need identified Physical:   High Fall Risk                                   Communication:   Progressive Cognitive Dysfunction, include wife and/or daughter in education      Plan:  Patient is scheduled for Watchman consult with Dr. Riley Nearing at the [facility] clinic on 10/31/2022.    Comments:  Patient's wife updated on current plan. Determined the best date for an appointment. Educated to all appointment information.

## 2022-10-23 ENCOUNTER — Encounter: Admit: 2022-10-23 | Discharge: 2022-10-23 | Payer: MEDICARE

## 2022-10-23 DIAGNOSIS — Z950 Presence of cardiac pacemaker: Secondary | ICD-10-CM

## 2022-10-23 NOTE — Telephone Encounter
-----   Message from Reino Kent sent at 10/23/2022 12:32 PM CDT -----  Regarding: new order for device ck  Can you please update device order for this pt so I can get him scheduled?    Thanks,  Ivin Booty

## 2022-10-31 ENCOUNTER — Encounter: Admit: 2022-10-31 | Discharge: 2022-10-31 | Payer: MEDICARE

## 2022-10-31 DIAGNOSIS — C801 Malignant (primary) neoplasm, unspecified: Secondary | ICD-10-CM

## 2022-10-31 DIAGNOSIS — I10 Essential (primary) hypertension: Secondary | ICD-10-CM

## 2022-10-31 DIAGNOSIS — I483 Typical atrial flutter: Secondary | ICD-10-CM

## 2022-10-31 DIAGNOSIS — I495 Sick sinus syndrome: Secondary | ICD-10-CM

## 2022-10-31 DIAGNOSIS — F419 Anxiety disorder, unspecified: Secondary | ICD-10-CM

## 2022-10-31 DIAGNOSIS — I480 Paroxysmal atrial fibrillation: Secondary | ICD-10-CM

## 2022-10-31 DIAGNOSIS — Z950 Presence of cardiac pacemaker: Secondary | ICD-10-CM

## 2022-10-31 DIAGNOSIS — R296 Repeated falls: Secondary | ICD-10-CM

## 2022-10-31 DIAGNOSIS — G4730 Sleep apnea, unspecified: Secondary | ICD-10-CM

## 2022-10-31 DIAGNOSIS — C4491 Basal cell carcinoma of skin, unspecified: Secondary | ICD-10-CM

## 2022-10-31 DIAGNOSIS — Z136 Encounter for screening for cardiovascular disorders: Secondary | ICD-10-CM

## 2022-10-31 DIAGNOSIS — H9190 Unspecified hearing loss, unspecified ear: Secondary | ICD-10-CM

## 2022-10-31 DIAGNOSIS — I4891 Unspecified atrial fibrillation: Secondary | ICD-10-CM

## 2022-10-31 DIAGNOSIS — Z9189 Other specified personal risk factors, not elsewhere classified: Secondary | ICD-10-CM

## 2022-10-31 DIAGNOSIS — N400 Enlarged prostate without lower urinary tract symptoms: Secondary | ICD-10-CM

## 2022-10-31 NOTE — Patient Instructions
Thank you so much for participating in shared decision making with Dr. Riley NearingGunasekaran. We have attached the shared decision making tool discussed during your visit.     Tempie HoistStephanie Stokka, RN is the Navigator for [employer] Watch[employer] procedure. She will be in touch with you to introduce herself and schedule the remainder of your appointments as well as review your Watchman procedure plan. Feel free to send an email to initiate contact.     Her contact information is as follows:    Tempie HoistStephanie Stokka, RN   Cardiac Navigator  [phone] office  [email or web address]    Feel free to reach out should you have questions.      Thank you for visiting our office today and choosing [facility] for your care.    My Nursing team's voice mail: [phone] - Please leave your name, date of birth and a brief message and someone will call you back.  Fax: [phone]   Scheduling: [phone]  (For appointments six months or further out, call approximately five months before appointment is due, for best availability.)     Please use the MyChart on-line portal (or mobile application) to send us a message.  NOTE: MyChart messages and phone calls received on weekends, on holidays, and after 4 pm on weekdays will NOT be seen until the following business day. If you have an urgent matter during these times, please call the main switchboard at [phone] to reach the on-call team.

## 2022-10-31 NOTE — Progress Notes
CARDIOLOGY CONSULT NOTE         Reason for Consult:    Transcatheter left atrial appendage occlusion with Watchman device.    History of Present Illness    Mr. 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  He sees Dr. Lujean Amel from our practice for his cardiovascular issues which include a history of an atrial flutter ablation in 2019 followed by paroxysmal nonvalvular atrial fibrillation on Eliquis therapy.  He also has an underlying dual-chamber permanent pacemaker.  He reports having no prior cardioversions but has been on rhythm control medications.  No prior history of TIA or stroke or major bleeding complications identified.  He has had a full total of 4 falls in the last 1 year and multiple near falls.  He has also had significant amount of skin bruises while on full therapeutic doses of Eliquis.  He is very concerned about his prospects of having a bleed down the road.  He also has a REM sleep disorder as well.  Given these findings, him and his wife are interested in considering transcatheter left atrial appendage occlusion as a possibility to get off of oral anticoagulants.  No prior blood transfusions.  No recurrent transseptal procedures.  No history of rheumatic heart disease or prosthetic or mechanical valves.  He still continues to take Eliquis and is good about adherence.  No sharp objects usage noted.  No prior cardiac surgeries.  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    Most recent 2D ECHO performed : 2019 TEE  1.  Normal left ventricle size.  Slight hypertrophy.  No regional wall motion abnormality.  Low normal ejection fraction=50-55%.  2.  No mitral stenosis or regurgitation.  3.  Slightly dilated left atrium.  No thrombus.  4.  Dilated right ventricle with normal wall thickness and normal ejection fraction.  No tricuspid regurgitation.  5.  Slightly dilated right atrium.  No thrombus.  6.  No pericardial effusion.  7.  Rhythm changed from atrial fibrillation to sinus rhythm during study.    Most recent left heart catheterization performed: Never had 1 done.      Assessment & Recs     Stanley Campbell is a 82 y.o. patient with the following problems:    Assessment:    Paroxysmal nonvalvular atrial fibrillation prior history of atrial flutter ablation  Dual-chamber permanent pacemaker placement  High bleeding risk  High stroke risk  Multiple falls in the last 1 year    Plan:    1.  The patient has had a significantly elevated risk of stroke based upon his CHA2DS2-VASc score and also a history of multiple skin bruises and at least 4 falls in the last 1 year which predispose him to a major bleed while on full therapeutic doses of Eliquis.  Given these findings, his primary cardiologist, Dr. Barry Dienes has discussed the prospects of transcatheter left atrial appendage occlusion with the patient.  Given elevated objective evidence of bleeding and stroke risk and advanced age above 1, he is a reasonable candidate for transcatheter left atrial appendage occlusion with Watchman device.  I have explained to him the pathophysiology of thrombus formation and the rationale for oral anticoagulation lifelong in this condition.  We also discussed about the procedural nuances of transcatheter left atrial appendage occlusion.  Shared decision was made between the patient, myself and primary cardiologist  Dr. Barry Dienes to proceed along this route after the patient was offered an evidence-based tool from our institution to proceed along this route and make his decision.  2.  Reviewed CTA from 2019: Ostium starts off at least 30 mm with an anteriorly directed appendage and a posteriorly directed distal lobe.  Overall, acceptable transcatheter left atrial appendage occlusion.  Recommend repeating a CTA given potential for the appendage to remodel over the last 6 years.  3.  No Lovenox bridging necessary at this juncture  4.  Procedure can be performed with intracardiac echo/moderate conscious sedation.    Shared Decision Making Note for Transcatheter Left Atrial Appendage Closure [facility]) for Non-Valvular Atrial Fibrillation    This patient is being referred to the [facility] [place] [facility] for evaluation for Left Atrial Appendage Closure [facility]) for management of stroke risk resulting from non-valvular atrial fibrillation (nonvalvular atrial fibrillation constitutes atrial fibrillation in the absence of moderate to severe mitral stenosis or any mechanical valve based on 2019 focused update on the management of atrial fibrillation from ACC/AHA)    Based on their past history and an evidence based tool, it has been determined that they are poor candidates for long-term oral-anticoagulation, however may be tolerant of short term treatment with warfarin or other direct acting oral anticoagulants as necessary.     Risk, benefits, alternatives of the procedure (technique of transcatheter left atrial appendage occlusion, need for general anesthesia or conscious sedation, procedure related complications including transseptal access related complications, intraprocedural stroke, bleeding, pericardial effusion, perforation needing cardiac surgery, complications from access sites, possible need for intracardiac echocardiography whenever clinically indicated and from the patient preference standpoint and other unforeseen complications etc. and in addition postprocedural care which includes oral anticoagulation/antiplatelet therapy for up to 6 months, repeat transesophageal echocardiography/CTA for follow-up assessment of DART/leaks of clinical significance etc. )were explained to the patient who verbalized understanding of these conditions and consented to the procedure.  I have also explained to the patient the incidence of device related thrombosis, clinically significant peridevice leaks communicating with the appendage with the possibility of prolonging oral antiplatelet/anticoagulation or needing additional closure devices/coils to seal residual leaks.  The patient verbalized understanding of these conditions and consented to the procedure.     Their individual CHA2DS2-VASc stroke risk score, based on past history is  indicated below:      The patient's current CHA2DS2-VASC score is: Hypertension-1, age above 75-1: Total 3    The patient's high bleeding risk on anticoagulation has been based upon the following clinical factors:          Current HAS-BLED score of the patient is hypertension-1, elderly-1, abnormal renal function-1: Total 3    We have discussed their unique stroke and bleeding risk both on and off oral-anticoagulation, and the rationale for this referral for transcatheter left atrial appendage closure.     Based on both stroke and bleeding risk, a shared decision has been made to pursue transcatheter closure of the left atrial appendage as a safe and effective alternative to oral anticoagulant therapy for stroke prevention and to reduce their long term risk of incidence of intra cerebral bleeding.    Thank you for allowing [place] to participate in the care of this patient.     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 EP LAB    PACEMAKER INSERTION Left 02/19/2017    INSERTION/ REPLACEMENT PERMANENT PACEMAKER WITH ATRIAL AND VENTRICULAR LEAD Left 02/19/2017    Performed by Miles Costain, MD at [facility] EP LAB    External Cardioversion N/A 02/19/2017    Performed by Miles Costain, MD at [facility] EP LAB    Removal Cardiac Event Recorder N/A 02/19/2017    Performed by Miles Costain, MD at [facility] EP LAB    INTRACARDIAC CATHETER ABLATION WITH COMPREHENSIVE ELECTROPHYSIOLOGIC EVALUATION - ATRIAL FIBRILLATION, CRYO N/A 10/11/2017    Performed by Smith Robert, MD at [facility] EP LAB    INTRACARDIAC ELECTROPHYSIOLOGIC 3-DIMENSIONAL MAPPING - PRECISION N/A 10/11/2017    Performed by Smith Robert, MD at [facility] EP LAB    INTRAVENOUS DRUG INFUSION FOR STIMULATION AND PACING N/A 10/11/2017    Performed by Smith Robert, MD at [facility] EP LAB    TRANSESOPHAGEAL ECHOCARDIOGRAM DURING INTERVENTION N/A 10/11/2017    Performed by Cath, Physician at [facility] EP LAB    CATARACT REMOVAL WITH IMPLANT Right     PROSTATE BIOPSY      SKIN CANCER EXCISION      TONSILLECTOMY         Social History  Social History     Socioeconomic History    Marital status: Married   Tobacco Use    Smoking status: Never    Smokeless tobacco: Never   Substance and Sexual Activity    Alcohol use: No    Drug use: No       Family History  Family History   Problem Relation Age of Onset    Hypertension Mother     Depression Mother     Hypertension Father     Cancer Father        Review of Systems    Review of Systems   Constitutional: Negative.   HENT: Negative.     Eyes: Negative.    Cardiovascular: Negative.    Respiratory: Negative.     Endocrine: Negative.    Hematologic/Lymphatic: Negative.    Skin: Negative.    Musculoskeletal: Negative.    Gastrointestinal: Negative.    Genitourinary: Negative.    Neurological: Negative.    Psychiatric/Behavioral: Negative.     Allergic/Immunologic: Negative.        A 10 point review of systems was performed and was noted to be unremarkable except for facts mentioned in the HPI.    Physical Exam                          Vital Signs: Most Recent   Vitals:    10/31/22 0932   BP: (!) 169/108   BP Source: Arm, Left  Upper   Pulse: 69   SpO2: 94%   O2 Device: None (Room air)   PainSc: Zero   Weight: 91.2 kg (201 lb)   Height: 182.9 cm (6')          Body mass index is 27.26 kg/m?Marland Kitchen    General Appearance: moderately overweight, no distress   Skin: warm, no ulcers or xanthomas; few ecchymoses   Eyes: conjunctivae and lids normal, pupils are equal and round   Neck Veins: normal JVP , neck veins are not distended   Thyroid: no nodules, masses, tenderness or enlargement   Cardiovascular system: Pulse 86/min, regular rhythm , normal volume, no specific character, felt equally in all peripheries and there was no radio femoral delay.  PMI undisplaced, no palpable thrills/heaves, S1 S2 heard, no murmurs, no rub, no jugular venous distension, no carotid bruit.  Peripheral Circulation: normal peripheral circulation   Pedal Pulses: normal symmetric pedal pulses   Carotid Arteries: normal carotid upstroke bilaterally, no bruits   Respiratory system: No acute distress  No use of accessory muscles  Normal vesicular breath sounds over all the lung fields bilaterally  No added sounds  Lab/Radiology/Other Diagnostic Tests:    Lab results pertaining to the cardiovascular system, imaging such as echocardiography and coronary angiography was reviewed and independent interpretation is provided as outlined above.    Total Time Today was 60 minutes in the following activities: Preparing to see the patient, Obtaining and/or reviewing separately obtained history, Performing a medically appropriate examination and/or evaluation, Counseling and educating the patient/family/caregiver, Ordering medications, tests, or procedures, Referring and communication with other health care professionals (when not separately reported), Documenting clinical information in the electronic or other health record, Independently interpreting results (not separately reported) and communicating results to the patient/family/caregiver, and Care coordination (not separately reported)

## 2022-11-01 ENCOUNTER — Encounter: Admit: 2022-11-01 | Discharge: 2022-11-01 | Payer: MEDICARE

## 2022-11-01 NOTE — Patient Instructions
Coronary CT Angiography Instructions    TRAYE BATES  [id number]  [birth year]  11/01/2022    ARRIVAL TIME    Please report to the Cardiovascular Medicine Clinic at the [facility] on: 11/30/22  Please arrive at the following time: 12:30 for 1:00 pre watchman cta       DO NOT EAT FOR 4 HOURS PRIOR TO YOUR PROCEDURE.may have toast or fruit before 7am  YOU SHOULD DRINK PLENTY OF CLEAR LIQUIDS UP TO ARRIVAL AT OFFICE.    Pre-Procedure Heart Rate Medication Instructions: TAke all AM meds It is important to take these medications exactly as written.  These medications prepare you for the procedure.                                      Do not take any non-steroidal inflammatory medications, (ibuprofen, Aleve, Advil, etc.) for 48 hours beginning the day of the procedure.    Hold All Diuretics For 24 Hours Beginning The Day Of The Procedure: Verified    You May Take All Other Medications With Water The Morning Of The Procedure: Verified              ALLERGIES    No Known Allergies    SPECIAL ALLERGY INSTRUCTIONS         CURRENT MEDICATIONS  Outpatient Encounter Medications as of 11/01/2022   Medication Sig Dispense Refill    5-hydroxytryptophan (5-HTP) (5-HTP PO) Take  by mouth at bedtime daily.      amLODIPine (NORVASC) 5 mg tablet Take one-half tablet by mouth daily. 90 tablet     apixaban (ELIQUIS) 5 mg tablet Take one tablet by mouth twice daily. 14 tablet 0    ascorbic acid (vitamin C) (VITAMIN C) 1,000 mg tablet Take one tablet by mouth daily.      BIOTIN PO Take  by mouth daily.      CALCIUM PO Take  by mouth daily.      choline salicyl/mag salicylate (CHOLINE,MAGNESIUM SALICYLATE PO) Take  by mouth daily.      desvenlafaxine (KHEDEZLA) 100 mg ER tablet Take one tablet by mouth daily.      docusate (COLACE) 100 mg capsule Take one capsule by mouth at bedtime daily.      finasteride (PROSCAR) 5 mg tablet Take one tablet by mouth daily.      flaxseed oil (OMEGA 3 PO) Take  by mouth.      glucos sul 2KCl/msm/chond/C/Mn (GLUCOSAMINE CHONDROITIN PO) Take  by mouth daily.      LUTEIN PO Take 1 tablet by mouth daily.      metoprolol tartrate 25 mg tablet Take one tablet by mouth twice daily. Patient has been taking 0.5 tablet in the morning and 1 tablet in the evening      milk of magnesia (CONC) 2,400 mg/10 mL oral suspension Take  by mouth at bedtime daily.      SAW PALMETTO PO Take  by mouth daily.      tamsulosin (FLOMAX) 0.4 mg capsule Take one capsule by mouth daily. Do not crush, chew or open capsules. Take 30 minutes following the same meal each day.       No facility-administered encounter medications on file as of 11/01/2022.       -No caffeine or smoking after midnight before the procedure.  -Please arrange for someone to drive you home after the procedure.  -  No driving for 3 hours after the procedure.  -Please leave all valuables at home (wallet/purse).              PRE-PROCEDURE LAB WORK    Other Lab: I stat    If you have any questions, please call the [facility] office at [phone] or [phone].    Form Completed By: Kevan Ny RN  Date Completed: 11/01/22

## 2022-11-30 ENCOUNTER — Ambulatory Visit: Admit: 2022-11-30 | Discharge: 2022-11-30 | Payer: MEDICARE

## 2022-11-30 ENCOUNTER — Encounter: Admit: 2022-11-30 | Discharge: 2022-11-30 | Payer: MEDICARE

## 2022-11-30 DIAGNOSIS — I480 Paroxysmal atrial fibrillation: Secondary | ICD-10-CM

## 2022-11-30 LAB — POC CREATININE, RAD: CREATININE, POC: 1.2 mg/dL (ref 0.4–1.24)

## 2022-11-30 MED ORDER — IOHEXOL 350 MG IODINE/ML IV SOLN
100 mL | Freq: Once | INTRAVENOUS | 0 refills | Status: CP
Start: 2022-11-30 — End: ?
  Administered 2022-11-30: 17:00:00 100 mL via INTRAVENOUS

## 2022-11-30 MED ORDER — SODIUM CHLORIDE 0.9 % IJ SOLN
50 mL | Freq: Once | INTRAVENOUS | 0 refills | Status: CP
Start: 2022-11-30 — End: ?
  Administered 2022-11-30: 17:00:00 50 mL via INTRAVENOUS

## 2022-11-30 NOTE — Progress Notes
Reviewed CTA  34 mm device with anterior curve would be sufficient  Can proceed with intracardiac echo and conscious sedation by Cath Lab RNs.  Can schedule whenever.  Thank you.

## 2022-11-30 NOTE — Telephone Encounter
-----   Message from Shelton Silvas, RN sent at 10/31/2022 10:26 AM CDT -----  Regarding: Dr Adline Potter saw for Stanley Campbell,    Dr. Adline Potter saw Stanley Campbell today at the [facility] clinic and said he is ready for scheduling for his Watchman.  I put your DOT Phrase information in his AVS and highlighted your phone number.  I told him you would call him in the next few days.  He is very anxious but very sweet.    Dr. Donnie Coffin wanted me to let you know (hope you understand his instructions):  - CTA  - No Bridging  - Intracardiac echo  - Conscience sedation  - No TEE    Have a great day!  Clydie Braun, RN

## 2022-12-03 ENCOUNTER — Encounter: Admit: 2022-12-03 | Discharge: 2022-12-03 | Payer: MEDICARE

## 2022-12-03 ENCOUNTER — Inpatient Hospital Stay: Admit: 2022-12-03 | Discharge: 2022-12-03 | Payer: MEDICARE

## 2022-12-03 DIAGNOSIS — I4891 Unspecified atrial fibrillation: Secondary | ICD-10-CM

## 2022-12-03 MED ORDER — ASPIRIN 325 MG PO TAB
325 mg | Freq: Once | ORAL | 0 refills
Start: 2022-12-03 — End: ?

## 2022-12-03 MED ORDER — ACETAMINOPHEN 325 MG PO TAB
650 mg | ORAL | 0 refills | PRN
Start: 2022-12-03 — End: ?

## 2022-12-03 MED ORDER — ALUMINUM-MAGNESIUM HYDROXIDE 200-200 MG/5 ML PO SUSP
30 mL | ORAL | 0 refills | PRN
Start: 2022-12-03 — End: ?

## 2022-12-03 MED ORDER — CEFAZOLIN 2 GRAM IV SOLR
2 g | Freq: Once | INTRAVENOUS | 0 refills
Start: 2022-12-03 — End: ?

## 2022-12-03 MED ORDER — MAGNESIUM HYDROXIDE 400 MG/5 ML PO SUSP
30 mL | ORAL | 0 refills | PRN
Start: 2022-12-03 — End: ?

## 2022-12-03 MED ORDER — CEFAZOLIN INJ 1GM IVP
1 g | INTRAVENOUS | 0 refills
Start: 2022-12-03 — End: ?

## 2022-12-03 NOTE — Patient Instructions
CARDIAC CATHETERIZATION   PRE-ADMISSION INSTRUCTIONS    Patient Name: Stanley Campbell  MRN#: [id number]  Date of Birth: 21-Mar-1941 (81 y.o.)  Today's Date: 12/03/2022    PROCEDURE:  You are scheduled for a  Left Atrial Appendage Closure Device Placement (Watchman)  with Dr. Jonell Cluck.    PROCEDURE DATE AND ARRIVAL TIME:  Your procedure date is 01/24/2023.  You will receive a call from the Cath lab staff between 8:00 a.m. and noon on the business day prior to your procedure to let you know at what time to arrive on the day of your procedure.    Please check in at the Admitting Desk in the [facility] for your procedure.   Address:  [address] [place], [place] [address]    [facility] Entrance and and take a right. Continue down the hallway past the Cardiovascular Medicine office. That hall will take you into the [facility]. Check in at the desk on the left side.)     (If you have further questions regarding your arrival time for the CV lab, please call [phone] by 3:00pm the day before your procedure. Please leave a message with your name and number, your call will be returned in a timely manner.)    PRE-PROCEDURE APPOINTMENTS:    01/24/2023 at 11:00 am   Office visit to update history and physical (requirement within 30 days of procedure) with  Wynelle Link APRN  at Cardiovascular Medicine [facility]  Cardiovascular Medicine  [facility]  [address], [address]  [place], [place]  [address]        01/24/2023   Pre-Admission lab work required within 14 days of procedure: BMP, CBC, Magnesium, and type & screen  at the lab of your choice.  Please get your labs drawn after your office visit and prior to leaving on this day.         FOOD AND DRINK INSTRUCTIONS  Nothing to eat after midnight before your procedure. No caffeine for 24 hours prior to your procedure. You will be under moderate sedation for your procedure.  You may drink clear liquids up to an hour before hospital arrival. This will be confirmed by the Cath lab staff the day before your procedure.     SPECIAL MEDICATION INSTRUCTIONS  Any new prescriptions will be sent to your pharmacy listed on file with [place].     apixaban (Eliquis) -- hold for 2 DOSES prior to procedure.  Your last dose will be on 01/23/2023 am dose.  This medication will be restarted after your procedure.    HOLD ALL over the counter vitamins or supplements on the morning of your procedure.      Additional Instructions  If you wear CPAP, please bring your mask and machine with you to the hospital.    Take a bath or shower with anti-bacterial soap the evening before, or the morning of the procedure.     Bring photo ID and your health insurance card(s).    Arrange for a driver to take you home from the hospital. Please arrange for a friend or family member to take you home from this test. You cannot take a Taxi, Benedetto Goad, or public transportation as there has to be a responsible person to help care for you after sedation    Bring an accurate list of your current medications with you to the hospital (all medications and supplements taken daily).  Please use the medication list below and  write in the date and time when you took your last dose before your procedure. Update this list of medications as needed.      Wear comfortable clothes and don't bring valuables, other than photo identification card, with you to the hospital.    Please pack a bag for an overnight stay.     Please review your pre-procedure instructions and bring them with you on the day of your procedure.  Call the office at [phone] with any questions. You may ask to speak with Dr. Patrica Duel nurse.        ALLERGIES  No Known Allergies      IMPORTANT INFORMATION:    VISITORS:  Per the current visitor policy for the Cardiovascular Labs, patients are allowed 1 visitor only in the pre/post rooms and no visitors are allowed to stay overnight with the patient.  There are several hotels near the hospital if you need to arrange for accommodations for your family or friend providing support and transportation home after your procedure.     FOLLOW UP APPTS:  There are several follow up appointments throughout the first year after implantation of this device.  I will contact you to schedule throughout the year.    Also, please remember:    You MAY NEED to take antibiotics prior to any dental procedures (including cleaning) for 6 months after your device placement.  This will help to prevent infection to the device.  Contact your primary care provider or cardiologist for an antibiotic prescription to be taken prior to any dental procedures.     _________________________________________  Form completed by: Tempie Hoist, BSN  Date completed: 12/03/22  Method: Via telephone and mailed to the patient.    Please let me know if you have any questions or concerns.    Thanks so much,  Tempie Hoist, BSN, RN, [employer]- Nurse Navigator   [facility] [employer] [facility] [place] [facility]  Cardiac Navigation  Phone: [phone]- [email or web address]  [address] [place], [place], [place] [address]

## 2022-12-21 ENCOUNTER — Encounter: Admit: 2022-12-21 | Discharge: 2022-12-21 | Payer: MEDICARE

## 2022-12-24 ENCOUNTER — Encounter: Admit: 2022-12-24 | Discharge: 2022-12-24 | Payer: MEDICARE

## 2022-12-24 DIAGNOSIS — Z950 Presence of cardiac pacemaker: Secondary | ICD-10-CM

## 2023-01-09 NOTE — Progress Notes
Cardiovascular Medicine     Date of Service: 01/10/2023    HPI     I had the pleasure of seeing Stanley Campbell for a followup visit in the Cardiovascular Medicine Clinic at [facility].     Stanley Campbell as you may recall is a 82 y.o. male patient of Dr. Barry Dienes and Dr. 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 He has had multiple falls in the last 1 year. He has also had significant amount of skin bruises while on full therapeutic doses of Eliquis. He is very concerned about his prospects of having a bleed down the road. He was evaluated for LAAO device/Watchman by Dr. Riley Nearing on 10/31/22. Given elevated objective evidence of bleeding, stroke risk, and advanced age above 14, he was deemed a reasonable candidate for transcatheter left atrial appendage closure.     He is scheduled for left atrial appendage closure on 12/21/22 with Dr. Riley Nearing. Last device check on 12/21/22 showed AP 34%, VP 0.1%, 0% AT/AF burden. We discussed the LAAC procedure in detail including pre procedure instructions. Stanley Campbell and his wife, Stanley Campbell, verbalized understanding. He denies angina or anginal equivalent, dyspnea, palpitations, lower extremity edema, orthopnea, presyncope, and syncope.     BP in office today 158/92. 135/98 upon recheck. He monitors his BP closely at home and states he is consistently ~ 120 mmHg systolic. He remains compliant with his medications.        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  Neck Veins: neck veins are flat & not distended  Carotid Arteries: no bruits  Chest Inspection: chest is normal in appearance  Auscultation/Percussion: lungs clear to auscultation, no rales, rhonchi, or wheezing  Cardiac Rhythm: regular rhythm & normal rate  Cardiac Auscultation: Normal S1 & S2.    Murmurs: no cardiac murmurs   Extremities: no lower extremity edema; 2+ symmetric distal pulses  Neurologic Exam: oriented to time, place and person; no focal neurologic deficits  Psychiatric: Normal mood and affect.  Behavior is normal. Judgment and thought content normal.        Cardiovascular Studies    Most recent results for 12-Lead ECG   ECG 12-LEAD    Collection Time: 01/10/23 10:46 AM   Result Value Status    VENTRICULAR RATE 65 Incomplete    P-R INTERVAL 162 Incomplete    QRS DURATION 100 Incomplete    Q-T INTERVAL 382 Incomplete    QTC CALCULATION (  BAZETT) 397 Incomplete    P AXIS 78 Incomplete    R AXIS -43 Incomplete    T AXIS 68 Incomplete    Impression    Normal sinus rhythm  Left axis deviation  Abnormal ECG  When compared with ECG of 31-Oct-2022 09:40,  Criteria for Septal infarct are no longer present        The 10-year ASCVD risk score (Arnett DK, et al., 2019) is: 35.2%*    Values used to calculate the score:      Age: 45 years      Sex: Male      Is Non-Hispanic African American: No      Diabetic: No      Tobacco smoker: No      Systolic Blood Pressure: 135 mmHg      Is BP treated: Yes      HDL Cholesterol: 76 mg/dL*      Total Cholesterol: 206 mg/dL*      * - Cholesterol units were assumed for this score calculation    Cardiovascular Health Factors  Vitals BP Readings from Last 3 Encounters:   01/10/23 (!) 135/98   10/31/22 (!) 169/108   10/09/22 (!) 148/86     Wt Readings from Last 3 Encounters:   01/10/23 88.5 kg (195 lb)   10/31/22 91.2 kg (201 lb)   10/09/22 92.2 kg (203 lb 3.2 oz)     BMI Readings from Last 3 Encounters:   01/10/23 26.45 kg/m?   10/31/22 27.26 kg/m?   10/09/22 27.56 kg/m?      Smoking Social History     Tobacco Use   Smoking Status Never   Smokeless Tobacco Never      Lipid Profile Cholesterol   Date Value Ref Range Status   07/26/2022 206 (H) <200 Final     HDL   Date Value Ref Range Status   07/26/2022 76  Final     LDL   Date Value Ref Range Status   07/26/2022 121 (H) <100 Final     Triglycerides   Date Value Ref Range Status   07/26/2022 48  Final      Blood Sugar No results found for: HGBA1C  Glucose   Date Value Ref Range Status   07/26/2022 94  Final   04/27/2021 95  Final   07/27/2020 89  Final   02/04/2017 99 65 - 99 mg/dL Final     Comment:                   Fasting reference interval               Problems Addressed Today  Encounter Diagnoses   Name Primary?    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   - He is scheduled for left atrial appendage closure on 12/21/22 with Dr. Riley Nearing. Last device check on 12/21/22 showed AP 34%, VP 0.1%, 0%  AT/AF burden. We discussed the LAAC procedure in detail including pre procedure instructions. [name] and his wife, Stanley Campbell, verbalized understanding. He denies angina or anginal equivalent, dyspnea, palpitations, lower extremity edema, orthopnea, presyncope, and syncope. No further questions or concerns at this time. I do not see any reason he cannot proceed with the procedure as planned.   - He is planning to have his pre procedure labs completed following today's visit.     Left atrial appendage occlusion documentation:  Pre-procedure only  Documentation of shared decision making (2 providers): Dr. Riley Nearing and Dr. Barry Dienes  An evidence-based tool (EBT) was provided to patient to ensure that the patients? health goals and preferences were covered.  A copy of the EBT can be accessed thru [facility] system under 'Left Atrial Appendage Occlusion (LAAO) Device Shared Decision-Making Tool'   Documentation @ 45 days, 6 months, 1 year and 2 years post implant:  NYHA class: I  -     CHA2DS2-VASc Score: HTN (1) and >_ 75 (2)  -     CHA2DSVASc Yearly Stroke Risk: 3=3.2%  HAS-BLED score: HTN (1), Abnormal Renal Function (1), and Elderly >65 (1)  -     HAS-BLED Yearly Risk: 3=3.74%     Hypertension:  BP in office today:  BP BP Readings from Last 3 Encounters:   01/10/23 (!) 135/98   10/31/22 (!) 169/108   10/09/22 (!) 148/86      - Currently taking amlodipine 2.5 mg daily and metoprolol tartrate 25 mg twice daily.  - BP in office today 158/92. 135/98 upon recheck. He monitors his BP closely at home and states he is consistently ~ 120 mmHg systolic.  - Encouraged ongoing home monitoring and to notify us of consistently elevated readings.     Follow Up Visit:  Follow up per post Watchman protocol.     I have personally documented the HPI, exam and medical decision making. I have instructed the patient on the plan of care and they verbalize understanding of the plan. Please see AVS for full patient teaching. Patient advised to call our office or message [place] via My Chart if s/he has any problems, questions, worsening symptoms, or concerns prior to the next appointment. Thank you for allowing me to participate in the care of this patient. If you have any questions please do not hesitate to contact our office.     This note was in part completed with Dragon, a speech recognition software.  Some grammatical and transcription errors may have occurred.  If you have any concern, please contact the the our office for clarification.     Wynelle Link, FNP, APRN, FNP-BC  Collaborating Physician: Dr. Shela Commons. Kvapil/ Dr. Ihor Austin  Cardiovascular Medicine at [facility]  Phone: [phone]           Current Medications (including today's revisions)   5-hydroxytryptophan (5-HTP) (5-HTP PO) Take  by mouth at bedtime daily.    amLODIPine (NORVASC) 5 mg tablet Take one-half tablet by mouth daily.    apixaban (ELIQUIS) 5 mg tablet Take one tablet by mouth twice daily.    ascorbic acid (vitamin C) (VITAMIN C) 1,000 mg tablet Take one tablet by mouth daily.    BIOTIN PO Take  by mouth daily.    CALCIUM PO Take  by mouth daily.    choline salicyl/mag salicylate (CHOLINE,MAGNESIUM SALICYLATE PO) Take  by mouth daily.    desvenlafaxine (KHEDEZLA) 100 mg ER tablet Take one  tablet by mouth daily.    docusate (COLACE) 100 mg capsule Take one capsule by mouth at bedtime daily.    finasteride (PROSCAR) 5 mg tablet Take one tablet by mouth daily.    flaxseed oil (OMEGA 3 PO) Take  by mouth.    glucos sul 2KCl/msm/chond/C/Mn (GLUCOSAMINE CHONDROITIN PO) Take  by mouth daily.    LUTEIN PO Take 1 tablet by mouth daily.    metoprolol tartrate 25 mg tablet Take one tablet by mouth twice daily. Patient has been taking 0.5 tablet in the morning and 1 tablet in the evening    milk of magnesia (CONC) 2,400 mg/10 mL oral suspension Take  by mouth at bedtime daily.    SAW PALMETTO PO Take  by mouth daily.    tamsulosin (FLOMAX) 0.4 mg capsule Take one capsule by mouth daily. Do not crush, chew or open capsules. Take 30 minutes following the same meal each day.

## 2023-01-10 ENCOUNTER — Encounter: Admit: 2023-01-10 | Discharge: 2023-01-10 | Payer: MEDICARE

## 2023-01-10 ENCOUNTER — Ambulatory Visit: Admit: 2023-01-10 | Discharge: 2023-01-10 | Payer: MEDICARE

## 2023-01-10 DIAGNOSIS — I10 Essential (primary) hypertension: Secondary | ICD-10-CM

## 2023-01-10 DIAGNOSIS — I480 Paroxysmal atrial fibrillation: Secondary | ICD-10-CM

## 2023-01-10 DIAGNOSIS — I4891 Unspecified atrial fibrillation: Secondary | ICD-10-CM

## 2023-01-10 DIAGNOSIS — N400 Enlarged prostate without lower urinary tract symptoms: Secondary | ICD-10-CM

## 2023-01-10 DIAGNOSIS — I483 Typical atrial flutter: Secondary | ICD-10-CM

## 2023-01-10 DIAGNOSIS — I495 Sick sinus syndrome: Secondary | ICD-10-CM

## 2023-01-10 DIAGNOSIS — C801 Malignant (primary) neoplasm, unspecified: Secondary | ICD-10-CM

## 2023-01-10 DIAGNOSIS — H9190 Unspecified hearing loss, unspecified ear: Secondary | ICD-10-CM

## 2023-01-10 DIAGNOSIS — Z136 Encounter for screening for cardiovascular disorders: Secondary | ICD-10-CM

## 2023-01-10 DIAGNOSIS — Z950 Presence of cardiac pacemaker: Secondary | ICD-10-CM

## 2023-01-10 DIAGNOSIS — C4491 Basal cell carcinoma of skin, unspecified: Secondary | ICD-10-CM

## 2023-01-10 DIAGNOSIS — G4730 Sleep apnea, unspecified: Secondary | ICD-10-CM

## 2023-01-10 DIAGNOSIS — F419 Anxiety disorder, unspecified: Secondary | ICD-10-CM

## 2023-01-10 LAB — BASIC METABOLIC PANEL
ANION GAP: 5 g/dL (ref 3–12)
BLD UREA NITROGEN: 27 mg/dL — ABNORMAL HIGH (ref 7–25)
CALCIUM: 9.9 mg/dL (ref 8.5–10.6)
CHLORIDE: 105 MMOL/L (ref 98–110)
CO2: 29 MMOL/L (ref 21–30)
CREATININE: 1.1 mg/dL (ref 0.4–1.24)
EGFR: >60 mL/min (ref >60)
GLUCOSE,PANEL: 85 mg/dL (ref 70–100)
POTASSIUM: 4.7 MMOL/L (ref 3.5–5.1)
SODIUM: 139 MMOL/L (ref 137–147)

## 2023-01-10 LAB — CBC
RBC COUNT: 4.4 M/UL (ref 4.4–5.5)
WBC COUNT: 4.9 K/UL (ref 4.5–11.0)

## 2023-01-10 LAB — MAGNESIUM: MAGNESIUM: 2.1 mg/dL (ref 1.6–2.6)

## 2023-01-11 ENCOUNTER — Encounter: Admit: 2023-01-11 | Discharge: 2023-01-11 | Payer: MEDICARE

## 2023-01-11 NOTE — Progress Notes
Medicare Primary No pre-certification is required.

## 2023-01-22 ENCOUNTER — Encounter: Admit: 2023-01-22 | Discharge: 2023-01-22 | Payer: MEDICARE

## 2023-01-24 ENCOUNTER — Encounter: Admit: 2023-01-24 | Discharge: 2023-01-24 | Payer: MEDICARE

## 2023-01-24 ENCOUNTER — Inpatient Hospital Stay: Admit: 2023-01-24 | Discharge: 2023-01-24 | Payer: MEDICARE

## 2023-01-24 DIAGNOSIS — G4730 Sleep apnea, unspecified: Secondary | ICD-10-CM

## 2023-01-24 DIAGNOSIS — F419 Anxiety disorder, unspecified: Secondary | ICD-10-CM

## 2023-01-24 DIAGNOSIS — I4891 Unspecified atrial fibrillation: Secondary | ICD-10-CM

## 2023-01-24 DIAGNOSIS — H9190 Unspecified hearing loss, unspecified ear: Secondary | ICD-10-CM

## 2023-01-24 DIAGNOSIS — C801 Malignant (primary) neoplasm, unspecified: Secondary | ICD-10-CM

## 2023-01-24 DIAGNOSIS — Z950 Presence of cardiac pacemaker: Secondary | ICD-10-CM

## 2023-01-24 DIAGNOSIS — I10 Essential (primary) hypertension: Secondary | ICD-10-CM

## 2023-01-24 DIAGNOSIS — C4491 Basal cell carcinoma of skin, unspecified: Secondary | ICD-10-CM

## 2023-01-24 DIAGNOSIS — I495 Sick sinus syndrome: Secondary | ICD-10-CM

## 2023-01-24 DIAGNOSIS — N400 Enlarged prostate without lower urinary tract symptoms: Secondary | ICD-10-CM

## 2023-01-24 DIAGNOSIS — I483 Typical atrial flutter: Secondary | ICD-10-CM

## 2023-01-24 MED ADMIN — SODIUM CHLORIDE 0.9 % IV SOLP [27838]: 500 mL | INTRAVENOUS | @ 12:00:00 | Stop: 2023-01-25 | NDC 00338004904

## 2023-01-24 MED ADMIN — ASPIRIN 325 MG PO TAB [681]: 325 mg | ORAL | @ 12:00:00 | Stop: 2023-01-24 | NDC 66553000101

## 2023-01-24 MED ADMIN — CEFAZOLIN INJ 1GM IVP [210319]: 1 g | INTRAVENOUS | @ 22:00:00 | Stop: 2023-01-24 | NDC 60505614200

## 2023-01-25 ENCOUNTER — Encounter: Admit: 2023-01-25 | Discharge: 2023-01-25 | Payer: MEDICARE

## 2023-01-25 DIAGNOSIS — I480 Paroxysmal atrial fibrillation: Secondary | ICD-10-CM

## 2023-01-29 ENCOUNTER — Encounter: Admit: 2023-01-29 | Discharge: 2023-01-29 | Payer: MEDICARE

## 2023-01-29 NOTE — Telephone Encounter
Patient's identity was verified using two patient identifiers (name an date of birth).    45 day post Watchman office visit and TEE (transesophageal echocardiogram)     Watchman implant completed 01/24/2023 with Dr. Riley Nearing.   Called patient to follow up with patient post implantation and schedule Watchman follow up appointment including TEE.    Assessment s/s of complications after LAAO (screening for bleeding event, neuro event, vascular complications, and delayed pericardial effusion).   Patient denies signs of bleeding.  Patient denies symptoms of stroke/TIA.  Patient denies swelling, bleeding or groin site pain.  Patient denies new chest pain or new shortness of breath.  Patient denies any hospitalizations or changes in OAC/ASA status since discharge from [facility] implant.     03/11/2023 at 09:00 am   Office Visit with Wynelle Link APRN at the [facility]  [facility]  [address]  [place], [place]  [address]      03/11/2023 at 09:30 am     (arrival time) TEE (Transesophageal Echocardiogram)  [address], [address]  [place], [place] [address]  TEE Screening completed:  Trouble Swallowing Food?  No  Esophageal Disease?  No  Esophageal Surgeries?  No  Loose Teeth?  No  TEE Instructions:  Nothing to eat or drink after midnight (including gum, mints, smokeless tobacco, communion).  You must have a designated person who will be driving you home and staying with you after the procedure.  Please take all prescribed medications the morning of your procedure with a sip of water unless otherwise instructed by your nurse.  HOLD all vitamin supplements, diuretics, oral diabetic medications and over-the-counter medications.  If you have any questions, please call the Procedure RNs office at [phone]     6 month post Watchman appointment:  07/17/2023 at 10:30 am Office Visit with Dr. Riley Nearing at the [facility]     Device Related Thrombus reduction:  Advised patient that they are to continue to take the Eliquis and Aspirin until otherwise informed by Dr. Hilma Favors team.  It is imperative they continue to take these medications as prescribed to reduce the risk of device related thrombus.  If they have any questions or concerns they are to call the office at [phone] as soon as possible but not to make any medication changes without the physician's instructions.     Patient verbalized understanding of date/time/location of all above appointments.

## 2023-02-26 ENCOUNTER — Encounter: Admit: 2023-02-26 | Discharge: 2023-02-26 | Payer: MEDICARE

## 2023-02-26 NOTE — Progress Notes
Report Sheet    TEE           Indication: Post Watchman, 45 days (01/24/23)  Ordering Provider: Riley Nearing, MD    Name: Stanley Campbell     Age: 82 y.o.    DOB: [birth year]     MRN: [id number]  Patient phone number: [phone]    Communication Barriers: None noted    Lab(s) needed: No open orders; NO ADD  Other: APPT PRIOR FOR H&P    Device check needed: No  Device:   Lab Results   Component Value Date/Time    GENERATOR Medtronic 12/17/2022 09:05 AM    EPDEVTYP IPG 12/17/2022 09:05 AM       Other implanted devices/pumps: NONE noted  Has Controller (pt to bring): No    Pt Called:   Arrival Time: 03/11/23 at 8:45am (clinic)  Driver Information:     Date of Last H&P: 01/10/23 Wynelle Link   Additional Information: UPDATED H&P:  9am with Wynelle Link    Prior TEE: 10/11/17     Prior DCCV:      Prior Echo:01/24/23       Previous TEE/DCCV details:     EF:   ECHO EF   Date Value Ref Range Status   10/11/2017 50 % Final       Anticoag:Eliquis     Dose:5mg      Frequency:BID     Missed:  Labs   INR    HGB 13.8 (01/10/23)   Platelets 199   Glucose 85   NA 139   K 4.7   Creatinine 1.12   Other      Medications to HOLD day of:  Vitamins/Supplements      Probe   Gastric Surgery    GERD    Swallow difficulty    Head/Neck Surg/Rad    Chest Surg/Rad Ablation; Pacemaker; Watchman   EGD    Varices/Esophag CA    GI bleed    Dental issues      Sedation   COPD    OSA/CPAP YES   Asthma    Pulm HTN    Anesthesia Issues    Smoker NEVER   ETOH & Frequency    Drug Use    Chronic Pain Med    GLP-1 Last dose:     Current Medications:    5-hydroxytryptophan (5-HTP) (5-HTP PO) Take  by mouth at bedtime daily.    amLODIPine (NORVASC) 5 mg tablet Take one-half tablet by mouth daily.    apixaban (ELIQUIS) 5 mg tablet Take one tablet by mouth twice daily.    ascorbic acid (vitamin C) (VITAMIN C) 1,000 mg tablet Take one tablet by mouth daily.    aspirin (ASPIRIN CHILDRENS) 81 mg chewable tablet Chew one tablet by mouth daily. Indications: treatment to prevent a heart attack    BIOTIN PO Take  by mouth daily.    CALCIUM PO Take  by mouth daily.    choline salicyl/mag salicylate (CHOLINE,MAGNESIUM SALICYLATE PO) Take  by mouth daily.    desvenlafaxine (KHEDEZLA) 100 mg ER tablet Take one tablet by mouth daily.    docusate (COLACE) 100 mg capsule Take one capsule by mouth at bedtime daily.    finasteride (PROSCAR) 5 mg tablet Take one tablet by mouth daily.    flaxseed oil (OMEGA 3 PO) Take  by mouth.    glucos sul 2KCl/msm/chond/C/Mn (GLUCOSAMINE CHONDROITIN PO) Take  by mouth daily.    LUTEIN PO Take 1 tablet by mouth daily.  metoprolol tartrate 25 mg tablet Take one tablet by mouth twice daily. Patient has been taking 0.5 tablet in the morning and 1 tablet in the evening    milk of magnesia (CONC) 2,400 mg/10 mL oral suspension Take  by mouth at bedtime daily.    SAW PALMETTO PO Take  by mouth daily.    tamsulosin (FLOMAX) 0.4 mg capsule Take one capsule by mouth daily. Do not crush, chew or open capsules. Take 30 minutes following the same meal each day.       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    Sleep apnea     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    Typical atrial flutter (HCC)       Surgical History:   Procedure Laterality Date    UMBILICAL HERNIA REPAIR  2014    ABLATION OF DYSRHYTHMIC FOCUS  02/18/2017    INTRACARDIAC CATHETER ABLATION WITH COMPREHENSIVE ELECTROPHYSIOLOGIC EVALUATION - TYPICAL FLUTTER N/A 02/18/2017    Performed by Smith Robert, MD at [facility] EP LAB    PACEMAKER INSERTION Left 02/19/2017    INSERTION/ REPLACEMENT PERMANENT PACEMAKER WITH ATRIAL AND VENTRICULAR LEAD Left 02/19/2017    Performed by Miles Costain, MD at [facility] EP LAB    External Cardioversion N/A 02/19/2017    Performed by Miles Costain, MD at [facility] EP LAB    Removal Cardiac Event Recorder N/A 02/19/2017    Performed by Miles Costain, MD at [facility] EP LAB    INTRACARDIAC CATHETER ABLATION WITH COMPREHENSIVE ELECTROPHYSIOLOGIC EVALUATION - ATRIAL FIBRILLATION, CRYO N/A 10/11/2017    Performed by Smith Robert, MD at [facility] EP LAB    INTRACARDIAC ELECTROPHYSIOLOGIC 3-DIMENSIONAL MAPPING - PRECISION N/A 10/11/2017    Performed by Smith Robert, MD at [facility] EP LAB    INTRAVENOUS DRUG INFUSION FOR STIMULATION AND PACING N/A 10/11/2017    Performed by Smith Robert, MD at [facility] EP LAB    TRANSESOPHAGEAL ECHOCARDIOGRAM DURING INTERVENTION N/A 10/11/2017    Performed by Cath, Physician at [facility] EP LAB    PERCUTANEOUS CLOSURE LEFT ATRIAL APPENDAGE WITH ENDOCARDIAL IMPLANT N/A 01/24/2023    Performed by Julienne Kass, MD at [facility] CATH LAB    INTRACARDIAC ECHOCARDIOGRAPHY N/A 01/24/2023    Performed by Julienne Kass, MD at [facility] CATH LAB    CATARACT REMOVAL WITH IMPLANT Right     PROSTATE BIOPSY      SKIN CANCER EXCISION      TONSILLECTOMY         Allergies: No Known Allergies  Sharyon Medicus, RN

## 2023-03-06 ENCOUNTER — Encounter: Admit: 2023-03-06 | Discharge: 2023-03-06 | Payer: MEDICARE

## 2023-03-07 NOTE — Progress Notes
Cardiovascular Medicine     Date of Service: 03/11/2023    HPI     I had the pleasure of seeing Stanley Campbell for a followup visit in the Cardiovascular Medicine Clinic at [facility] of [facility].     Stanley Campbell as you may recall is a 82 y.o. male patient of Dr. Barry Dienes and Dr. 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    He has had multiple falls in the last 1 year. He has also had significant amount of skin bruises while on full therapeutic doses of Eliquis. Given concerns about his prospects of having a bleed down the road. He was evaluated for LAAO device/Watchman by Dr. Riley Nearing on 10/31/22. Given elevated objective evidence of bleeding, stroke risk, and advanced age above 22, he was deemed a reasonable candidate for transcatheter left atrial appendage closure.     Stanley Campbell was admitted and underwent implantation of a Watchman LAA occlusion device with Dr. Riley Nearing on 01/24/23.  He was continued on Eliquis post procedure. He will also be on Aspirin 81mg  daily. A limited echocardiogram revealed no pericardial effusion. A chest xray revealed the Watchman device was in the proper position and no pneumothorax.     He returns to clinic today prior to his 45 day post Watchman TEE accompanied by his wife who will be providing transportation. We discussed the TEE procedure in detail. Stanley Campbell verbalized understanding. He has remained NPO since 0000 and self administered his morning medications as directed. He denies difficulty swallowing, angina or anginal equivalent, dyspnea, palpitations, lower extremity edema, orthopnea, presyncope, and syncope. No recent falls. BP in office today 174/110 which improved to 165/99. Antihypertensive regimen includes amlodipine 2.5 mg daily and metoprolol tartrate 12.5 mg in am and 25 mg at night. Stanley Campbell and his wife tell me they monitor BP at home regularly which ranges from 122-134 mmHg systolic. They tell me that he often struggles with episodes of hypotension.      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  Neck Veins: neck veins are flat & not distended  Carotid Arteries: no bruits  Chest Inspection: chest is normal in appearance  Auscultation/Percussion: lungs clear to auscultation, no rales, rhonchi, or wheezing  Cardiac Rhythm: regular rhythm & normal rate  Cardiac Auscultation: Normal S1 & S2.    Murmurs: no cardiac murmurs   Extremities: no lower extremity edema; 2+ symmetric distal pulses  Neurologic Exam: oriented to time, place and person; no focal neurologic deficits  Psychiatric: Normal mood and affect.  Behavior is normal. Judgment and thought content normal.        Cardiovascular Studies    Most recent results for 12-Lead ECG   ECG 12-LEAD    Collection Time: 03/11/23  8:34 AM   Result Value Status    VENTRICULAR RATE 70 Incomplete    P-R INTERVAL 184 Incomplete    QRS DURATION 110 Incomplete    Q-T INTERVAL 384 Incomplete    QTC CALCULATION (BAZETT) 414 Incomplete    P AXIS 50 Incomplete    R AXIS -12 Incomplete    T AXIS 11 Incomplete    Impression    Normal sinus rhythm  Normal ECG  When compared with ECG of 10-Jan-2023 10:46,  No significant change was found      Cardiovascular Health Factors  Vitals BP Readings from Last 3 Encounters:   03/11/23 (!) 165/99   01/24/23 (!) 160/95   01/10/23 (!) 135/98     Wt Readings from Last 3 Encounters:   03/11/23 88.5 kg (195 lb)   01/24/23 86.2 kg (190 lb)   01/10/23 88.5 kg (195 lb)     BMI Readings from Last 3 Encounters:   03/11/23 26.45 kg/m?   01/24/23 25.77 kg/m?   01/10/23 26.45 kg/m?      Smoking Social History     Tobacco Use   Smoking Status Never   Smokeless Tobacco Never      Lipid Profile Cholesterol   Date Value Ref Range Status   07/26/2022 206 (H) <200 Final     HDL   Date Value Ref Range Status   07/26/2022 76  Final     LDL   Date Value Ref Range Status   07/26/2022 121 (H) <100 Final     Triglycerides   Date Value Ref Range Status   07/26/2022 48  Final      Blood Sugar No results found for: HGBA1C  Glucose   Date Value Ref Range Status   01/10/2023 85 70 - 100 MG/DL Final   91/47/8295 94  Final   04/27/2021 95  Final   02/04/2017 99 65 - 99 mg/dL Final     Comment:                   Fasting reference interval          Glucose, POC   Date Value Ref Range Status   01/24/2023 82 70 - 100 MG/DL Final          Problems Addressed Today  Encounter Diagnoses   Name Primary?    Cardiovascular symptoms Yes    Hypertension, unspecified type     Paroxysmal atrial fibrillation (HCC)     Presence of cardiac pacemaker     Presence of Watchman left atrial appendage closure device      Assessment and Plan     Paroxysmal atrial fibrillation  S/p Watchman on 01/24/23 with Dr. 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 Stanley Campbell was admitted and underwent implantation of a Watchman LAA occlusion device with Dr. Riley Nearing on 01/24/23.  He was continued on Eliquis post procedure. He will also be on Aspirin 81mg  daily.  A limited echocardiogram revealed no pericardial effusion. A chest xray revealed the Watchman device was in the proper position and no pneumothorax.   - He remains on Eliquis 5 mg twice daily and Aspirin 81 mg daily without s/s of bleeding or recurrent falls  - He remains on metoprolol tartrate twice daily  - Device interrogation 12/21/22 with 0% AT burden.  - He returns to clinic today prior to his 45 day post Watchman TEE accompanied by his wife who will be providing transportation. We discussed the TEE procedure in detail. Stanley Campbell verbalized understanding. He has remained NPO since 0000 and self administered his morning medications as directed. He denies difficulty swallowing, angina or anginal equivalent, dyspnea, palpitations, lower extremity edema, orthopnea, presyncope, and syncope. No recent falls. BP in office today 174/110 which improved to 165/99. Antihypertensive regimen including amlodipine 2.5 mg daily and metoprolol tartrate 12.5 mg in am and 25 mg at night. Stanley Campbell and his wife tell me they monitor BP at home and regularly ranges from 122-134 mmHg systolic. They tell me that he often struggles with episodes of hypotension. No further questions or concerns at this time. I do not see any reason he cannot proceed with the procedure as planned.   Stanley Campbell has an upcoming dental appointment this week and is requesting SBE prophylaxis. Will send one time dose of Keflex 2g for prophylaxis prior to his dental visit.    Documentation @ 45 days, 6 months, 1 year and 2 years post implant:  NYHA class: I  -     CHA2DS2-VASc Score: HTN (1) and >_ 75 (2)  -     CHA2DSVASc Yearly Stroke Risk: 3=3.2%  HAS-BLED score: HTN (1), Abnormal Renal Function (1), and Elderly >65 (1)  -     HAS-BLED Yearly Risk: 3=3.74%     Hypertension:  BP in office today:  BP BP Readings from Last 3 Encounters:   03/11/23 (!) 165/99   01/24/23 (!) 160/95   01/10/23 (!) 135/98      - BP in office today 174/110 which improved to 165/99. Antihypertensive regimen including amlodipine 2.5 mg daily and metoprolol tartrate 12.5 mg in am and 25 mg at night. Stanley Campbell and his wife tell me they monitor BP at home and regularly ranges from 122-134 mmHg systolic. They tell me that he often struggles with episodes of hypotension.   - We discussed significant hypertension on last two office visits. Encouraged ongoing home monitoring and to notify us of consistently elevated readings > 140 mmHg at home. Could consider increase in either his amlodipine or metoprolol.     Follow Up Visit:  Follow up per post Watchman protocol.     I have personally documented the HPI, exam and medical decision making. I have instructed the patient on the plan of care and they verbalize understanding of the plan. Please see AVS for full patient teaching. Patient advised to call our office or message [place] via My Chart if s/he has any problems, questions, worsening symptoms, or concerns prior to the next appointment.        Thank you for allowing me to participate in the care of this patient. If you have any questions please do not hesitate to contact our office.     This note was in part completed with Dragon, a speech recognition software.  Some grammatical and transcription errors may have occurred.  If you have any concern, please contact the the our office for clarification.     Tobi Bastos  Stanley Cooter, FNP, APRN, FNP-BC  Collaborating Physician: Dr. Shela Commons. Kvapil/ Dr. Ihor Austin  Cardiovascular Medicine at [facility] [place] [facility]  Phone: [phone]           Current Medications (including today's revisions)   5-hydroxytryptophan (5-HTP) (5-HTP PO) Take  by mouth at bedtime daily.    amLODIPine (NORVASC) 5 mg tablet Take one-half tablet by mouth daily.    apixaban (ELIQUIS) 5 mg tablet Take one tablet by mouth twice daily. ascorbic acid (vitamin C) (VITAMIN C) 1,000 mg tablet Take one tablet by mouth daily.    aspirin (ASPIRIN CHILDRENS) 81 mg chewable tablet Chew one tablet by mouth daily. Indications: treatment to prevent a heart attack    BIOTIN PO Take  by mouth daily.    CALCIUM PO Take  by mouth daily.    cephalexin (KEFLEX) 500 mg capsule Take four capsules by mouth once for 1 dose. For SBE prophylaxis prior to dental procedure.  Indications: infection prophylaxis, medical    choline salicyl/mag salicylate (CHOLINE,MAGNESIUM SALICYLATE PO) Take  by mouth daily.    desvenlafaxine (KHEDEZLA) 100 mg ER tablet Take one tablet by mouth daily.    docusate (COLACE) 100 mg capsule Take one capsule by mouth at bedtime daily.    finasteride (PROSCAR) 5 mg tablet Take one tablet by mouth daily.    flaxseed oil (OMEGA 3 PO) Take  by mouth.    glucos sul 2KCl/msm/chond/C/Mn (GLUCOSAMINE CHONDROITIN PO) Take  by mouth daily.    LUTEIN PO Take 1 tablet by mouth daily.    metoprolol tartrate 25 mg tablet Take one tablet by mouth twice daily. Patient has been taking 0.5 tablet in the morning and 1 tablet in the evening    milk of magnesia (CONC) 2,400 mg/10 mL oral suspension Take  by mouth at bedtime daily.    SAW PALMETTO PO Take  by mouth daily.    tamsulosin (FLOMAX) 0.4 mg capsule Take one capsule by mouth daily. Do not crush, chew or open capsules. Take 30 minutes following the same meal each day.

## 2023-03-11 ENCOUNTER — Ambulatory Visit: Admit: 2023-03-11 | Discharge: 2023-03-12 | Payer: MEDICARE

## 2023-03-11 ENCOUNTER — Ambulatory Visit: Admit: 2023-03-11 | Discharge: 2023-03-11 | Payer: MEDICARE

## 2023-03-11 ENCOUNTER — Encounter: Admit: 2023-03-11 | Discharge: 2023-03-11 | Payer: MEDICARE

## 2023-03-11 DIAGNOSIS — I10 Essential (primary) hypertension: Secondary | ICD-10-CM

## 2023-03-11 DIAGNOSIS — R0989 Other specified symptoms and signs involving the circulatory and respiratory systems: Secondary | ICD-10-CM

## 2023-03-11 DIAGNOSIS — Z95818 Presence of other cardiac implants and grafts: Secondary | ICD-10-CM

## 2023-03-11 DIAGNOSIS — Z950 Presence of cardiac pacemaker: Secondary | ICD-10-CM

## 2023-03-11 DIAGNOSIS — H9190 Unspecified hearing loss, unspecified ear: Secondary | ICD-10-CM

## 2023-03-11 DIAGNOSIS — N400 Enlarged prostate without lower urinary tract symptoms: Secondary | ICD-10-CM

## 2023-03-11 DIAGNOSIS — I480 Paroxysmal atrial fibrillation: Secondary | ICD-10-CM

## 2023-03-11 DIAGNOSIS — G4730 Sleep apnea, unspecified: Secondary | ICD-10-CM

## 2023-03-11 DIAGNOSIS — I4891 Unspecified atrial fibrillation: Secondary | ICD-10-CM

## 2023-03-11 DIAGNOSIS — F419 Anxiety disorder, unspecified: Secondary | ICD-10-CM

## 2023-03-11 DIAGNOSIS — I483 Typical atrial flutter: Secondary | ICD-10-CM

## 2023-03-11 DIAGNOSIS — C4491 Basal cell carcinoma of skin, unspecified: Secondary | ICD-10-CM

## 2023-03-11 DIAGNOSIS — C801 Malignant (primary) neoplasm, unspecified: Secondary | ICD-10-CM

## 2023-03-11 DIAGNOSIS — I495 Sick sinus syndrome: Secondary | ICD-10-CM

## 2023-03-11 MED ORDER — LABETALOL 5 MG/ML IV SYRG
INTRAVENOUS | 0 refills | Status: DC
Start: 2023-03-11 — End: 2023-03-11

## 2023-03-11 MED ORDER — PHENYLEPHRINE HCL IN 0.9% NACL 1 MG/10 ML (100 MCG/ML) IV SYRG
INTRAVENOUS | 0 refills | Status: DC
Start: 2023-03-11 — End: 2023-03-11

## 2023-03-11 MED ORDER — FENTANYL CITRATE (PF) 50 MCG/ML IJ SOLN
25-50 ug | INTRAVENOUS | 0 refills | PRN
Start: 2023-03-11 — End: ?

## 2023-03-11 MED ORDER — ONDANSETRON HCL (PF) 4 MG/2 ML IJ SOLN
4 mg | Freq: Once | INTRAVENOUS | 0 refills | PRN
Start: 2023-03-11 — End: ?

## 2023-03-11 MED ORDER — SODIUM CHLORIDE 0.9 % IV SOLP (OR) 500ML
INTRAVENOUS | 0 refills | Status: DC
Start: 2023-03-11 — End: 2023-03-11

## 2023-03-11 MED ORDER — PROPOFOL 10 MG/ML IV EMUL 20 ML (INFUSION)(AM)(OR)
INTRAVENOUS | 0 refills | Status: DC
Start: 2023-03-11 — End: 2023-03-11
  Administered 2023-03-11: 16:00:00 75 ug/kg/min via INTRAVENOUS

## 2023-03-11 MED ORDER — CEPHALEXIN 500 MG PO CAP
2000 mg | ORAL_CAPSULE | Freq: Once | ORAL | 0 refills | Status: AC
Start: 2023-03-11 — End: ?

## 2023-03-11 MED ORDER — PROPOFOL INJ 10 MG/ML IV VIAL
INTRAVENOUS | 0 refills | Status: DC
Start: 2023-03-11 — End: 2023-03-11

## 2023-03-11 MED ORDER — LIDOCAINE (PF) 20 MG/ML (2 %) IJ SOLN
INTRAVENOUS | 0 refills | Status: DC
Start: 2023-03-11 — End: 2023-03-11

## 2023-03-11 NOTE — Anesthesia Pre-Procedure Evaluation
Anesthesia Pre-Procedure Evaluation    Name: Stanley Campbell      MRN: [id number]     DOB: 09/17/1940     Age: 82 y.o.     Sex: male   __________________________________________________________________________     Procedure Date: 03/11/2023   Procedure: * No procedures listed *     Physical Assessment  Vital Signs (last filed in past 24 hours):  BP: 197/115 (08/12 0959)  Pulse: 76 (08/12 0959)  Respirations: 18 PER MINUTE (08/12 0959)  SpO2: 100 % (08/12 0959)  O2 Device: None (Room air) (08/12 0959)  Height: 182.9 cm (6') (08/12 0828)  Weight: 88.5 kg (195 lb) (08/12 4540)      Patient History  No Known Allergies     Current Medications    Medication Directions   5-hydroxytryptophan (5-HTP) (5-HTP PO) Take  by mouth at bedtime daily.   amLODIPine (NORVASC) 5 mg tablet Take one-half tablet by mouth daily.   apixaban (ELIQUIS) 5 mg tablet Take one tablet by mouth twice daily.   ascorbic acid (vitamin C) (VITAMIN C) 1,000 mg tablet Take one tablet by mouth daily.   aspirin (ASPIRIN CHILDRENS) 81 mg chewable tablet Chew one tablet by mouth daily. Indications: treatment to prevent a heart attack   BIOTIN PO Take  by mouth daily.   CALCIUM PO Take  by mouth daily.   cephalexin (KEFLEX) 500 mg capsule Take four capsules by mouth once for 1 dose. For SBE prophylaxis prior to dental procedure.  Indications: infection prophylaxis, medical   choline salicyl/mag salicylate (CHOLINE,MAGNESIUM SALICYLATE PO) Take  by mouth daily.   desvenlafaxine (KHEDEZLA) 100 mg ER tablet Take one tablet by mouth daily.   docusate (COLACE) 100 mg capsule Take one capsule by mouth at bedtime daily.   finasteride (PROSCAR) 5 mg tablet Take one tablet by mouth daily.   flaxseed oil (OMEGA 3 PO) Take  by mouth.   glucos sul 2KCl/msm/chond/C/Mn (GLUCOSAMINE CHONDROITIN PO) Take  by mouth daily.   LUTEIN PO Take 1 tablet by mouth daily.   metoprolol tartrate 25 mg tablet Take one tablet by mouth twice daily. Patient has been taking 0.5 tablet in the morning and 1 tablet in the evening   milk of magnesia (CONC) 2,400 mg/10 mL oral suspension Take  by mouth at bedtime daily.   SAW PALMETTO PO Take  by mouth daily.   tamsulosin (FLOMAX) 0.4 mg capsule Take one capsule by mouth daily. Do not crush, chew or open capsules. Take 30 minutes following the same meal each day.     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    Typical atrial flutter (HCC)      Surgical History:  Procedure Laterality Date    UMBILICAL HERNIA REPAIR  2014    ABLATION OF DYSRHYTHMIC FOCUS  02/18/2017    INTRACARDIAC CATHETER ABLATION WITH COMPREHENSIVE ELECTROPHYSIOLOGIC EVALUATION - TYPICAL FLUTTER N/A 02/18/2017    Performed by Smith Robert, MD at [facility] EP LAB    PACEMAKER INSERTION Left 02/19/2017    INSERTION/ REPLACEMENT PERMANENT PACEMAKER WITH ATRIAL AND VENTRICULAR LEAD Left 02/19/2017    Performed by Miles Costain, MD at [facility] EP LAB    External Cardioversion N/A 02/19/2017    Performed by Miles Costain, MD at [facility] EP LAB    Removal Cardiac Event Recorder N/A 02/19/2017    Performed by Miles Costain, MD at [facility] EP LAB    INTRACARDIAC CATHETER ABLATION WITH COMPREHENSIVE ELECTROPHYSIOLOGIC EVALUATION - ATRIAL FIBRILLATION, CRYO N/A 10/11/2017    Performed by Smith Robert, MD at [facility] EP LAB    INTRACARDIAC ELECTROPHYSIOLOGIC 3-DIMENSIONAL MAPPING - PRECISION N/A 10/11/2017    Performed by Smith Robert, MD at [facility] EP LAB INTRAVENOUS DRUG INFUSION FOR STIMULATION AND PACING N/A 10/11/2017    Performed by Smith Robert, MD at [facility] EP LAB    TRANSESOPHAGEAL ECHOCARDIOGRAM DURING INTERVENTION N/A 10/11/2017    Performed by Cath, Physician at [facility] EP LAB    PERCUTANEOUS CLOSURE LEFT ATRIAL APPENDAGE WITH ENDOCARDIAL IMPLANT N/A 01/24/2023    Performed by Julienne Kass, MD at [facility] CATH LAB    INTRACARDIAC ECHOCARDIOGRAPHY N/A 01/24/2023    Performed by Julienne Kass, MD at [facility] CATH LAB    CATARACT REMOVAL WITH IMPLANT Right     PROSTATE BIOPSY      SKIN CANCER EXCISION      TONSILLECTOMY         Review of Systems/Medical History      Patient summary reviewed  Nursing notes reviewed  Pertinent labs reviewed    PONV Screening: Non-smoker    No history of anesthetic complications    No family history of anesthetic complications      Airway - negative        Pulmonary - negative          Cardiovascular       Recent diagnostic studies:          ECG          EKG: NSR, LAFB. Probable LVH     Echo 02/21/2017- ? Chamber sizes are normal  ? Normal left ventricular systolic function, with an ejection fraction estimated at 55%  ? Atrial fibrillation  ? No hemodynamically significant valvular abnormalities   ? Pulmonary artery pressure is estimated at 38 mmHg             Exercise tolerance: >4 METS (Pt is able to walk 4 blockes without CP or dyspnea. )       Beta Blocker therapy: Yes      Beta blockers within 24 hours: Yes       : pacemaker (sss, PAF- DDD-PM)          Manufacture: Medtronic  Hypertension, well controlled          No past MI      No hx of coronary artery disease      No coronary artery bypass graft        No palpitations          Dysrhythmias (A. fib/flutter- on solotol and Eliquis.  S/p prior ablation.   SSS s/p PPM placement ); atrial fibrillation        No hyperlipidemia      No orthopnea      No indications/hx of PND      No dyspnea on exertion      No syncope      01/24/23: PERCUTANEOUS CLOSURE LEFT ATRIAL APPENDAGE WITH    01/24/23 Limited TTE:  Left Ventricle The left ventricular systolic function is normal.  IVC/SVC Normal central venous pressure (0-5 mm Hg).  Pericardium No pericardial effusion.            GI/Hepatic/Renal              Renal disease (CKD  stage III, baseline Cr. ~1.2 ):           Neuro/Psych         Sensory deficit (HOH , pt has hearing aids)        Psychiatric history (sleep disturbance.  Pt states mood is controlled without medication )          Depression          Anxiety      Musculoskeletal         Right great tone infection, pt is on Augmentin (day 4/10)         Endocrine/Other             Malignancy (SCCa s/p excison of nose with repair of defect ):         Physical Exam    Airway Findings      Mallampati: II      TM distance: >3 FB      Neck ROM: full      Mouth opening: good      Airway patency: adequate    Dental Findings: Negative      Cardiovascular Findings:       Rhythm: irregular      Rate: normal      No murmur, no carotid bruit, no peripheral edema    Pulmonary Findings:       Breath sounds clear to auscultation.    Abdominal Findings:       Not obese    Neurological Findings:         Comments: A&O        Diagnostic Tests  Hematology:   Lab Results   Component Value Date    HGB 13.8 01/10/2023    HCT 40.7 01/10/2023    PLTCT 199 01/10/2023    WBC 4.9 01/10/2023    NEUT 69.4 10/09/2017    ANC 4.7 10/09/2017    ALC 1.3 10/09/2017    MONA 7.6 10/09/2017    AMC 0.5 10/09/2017    EOSA 2 10/04/2017    ABC 0.1 10/09/2017    MCV 91.1 01/10/2023    MCH 31.0 01/10/2023    MCHC 34.0 01/10/2023    MPV 7.7 01/10/2023    RDW 13.8 01/10/2023         General Chemistry:   Lab Results   Component Value Date    NA 139 01/10/2023    K  4.7 01/10/2023    CL 105 01/10/2023    CO2 29 01/10/2023    GAP 5 01/10/2023    BUN 27 01/10/2023    CR 1.12 01/10/2023    GLU 85 01/10/2023    GLU 99 02/04/2017    CA 9.9 01/10/2023    ALBUMIN 4.0 07/26/2022    MG 2.1 01/10/2023    TOTBILI 0.60 07/26/2022      Coagulation:   Lab Results   Component Value Date    INR 1.0 01/24/2023    INR 1.6 02/22/2017         Anesthesia Plan    ASA score: 3   Plan: MAC  Induction method: intravenous  NPO status: acceptable      Informed Consent  Anesthetic plan and risks discussed with patient.  Use of blood products discussed with patient  Blood Consent: consented      Plan discussed with: anesthesiologist and CRNA.   Labs Per Dr. Naoma Diener: BMP, CBC, Mg  T&C     Pt with uncontrolled HTN. BP 237/126, recheck 223/115  BP   09/24/17:  226/126  08/28/17:   190/110  05/21/2017:  140/100  Prior reading pt appears to be more normotensive.      Pt states at home his SBP runs 97-101.  He did not bring his bp log with him today.  Advised pt to be seen in ED, pt declined.  Denies CP, dyspnea, HA, vision changes, paresthesias, weakness.  Discussed s/s of cardiac and neuro events and advised pt he is at increased risk of CVA/cardiac event.  Advised pt to seek immediate medical attention if he develops symptoms of neuro or cardiac event.  Discussed with pt if BP if poorly controlled DOS surgery may be delayed.     Called Dr. Orvan July office (pt's cardiologist) spoke with Armstead Peaks, RN with Dr. Cornelius Moras.  Discussed elevated BP which has been present since 04/2017.  Will await further recommendations.     PPM interrogation 09/04/2017  Presenting EGM shows AP-VS. Battery longevity 13.1 yrs.     Events noted since 05/23/17:  Atrial:  115 for 18.5% of time. PAF noted.  Fast A&V:  none  Ventricular:  one noted.     RV Pacing%: 3.1%.  Cardiac compass trends are stable and show some PAF.  HR histogram show nice rate distribution.       Next follow up appt is due in July with Dr. Naoma Diener.  Next remote scheduled for 11/22/17.  Results routed to Dr. Naoma Diener for signature and review.  ____________________________________________________________________________         Scans on Order [id number]     HRM Data Sheets - Document on 09/04/2017 1603: Lorincz. Brad  1.25.19  [email or web address]   Device Check Results     Device Type: DDD-PM  Generator Manufacturer: Medtronic  Device Indication: SSS, paroxysmal atrial fib  Device Implanted by: LDB  Device Compatible with Universal Transmitter: Carelink Express   Known Diagnosed AFib: Yes  On Anticoagulation: Yes       Known Diagnosed VT/VF:    ATP:      Shock:      Programming Changes:   Advisory Info: 7/24/18Removal Cardiac Event Recorder         Programming/Interrogation  Device Function WNL:    Pacemaker Dependant: No  Initial Rhythm:      Underlying Rhythm:    AS-VS%:    AS-VP%:    AP-VS%:    AP-VP%:    Battery Voltage:  Estimated Longevity:    Magnet Rate:    ERI/EOL Indicator: 2.63 V  Changes:    Change Comments:     Counters Cleared:     Saved To Disc:       Arrhythmia Events     # Mode S. Events:     # High AT/AF Events:     # High V Events:     Time in AT/AF:     V Rate in AT/AF:     Single PVCs:     PVC runs:            Addendum:  Reviewed telephone encounter from Dr. Cornelius Moras 10/04/2017- most hypertension seems to be associated with anxiety related to appointments/procedures. Script for xanax called in  [facility] Plan  Alerts

## 2023-03-11 NOTE — Anesthesia Post-Procedure Evaluation
Post-Anesthesia Evaluation    Name: Stanley Campbell      MRN: [id number]     DOB: 1940-08-18     Age: 82 y.o.     Sex: male   __________________________________________________________________________     Procedure Information       Anesthesia Start Date/Time: 03/11/23 1030    Scheduled providers: Otilio Saber, MD; Cammy Brochure, RN    Procedure: TRANSESOPHAGEAL ECHO    Location: Cardiovascular Medicine: [facility]            Post-Anesthesia Vitals  BP: 128/83 (08/12 1119)  Pulse: 66 (08/12 1119)  Respirations: 15 PER MINUTE (08/12 1119)  SpO2: 97 % (08/12 1119)  O2 Device: None (Room air) (08/12 1119)  Height: 182.9 cm (6') (08/12 1109) No vitals data found for the desired time range.        Post Anesthesia Evaluation Note    Evaluation location: Pre/Post  Patient participation: recovered; patient participated in evaluation  Level of consciousness: alert    Pain score: 0  Pain management: adequate    Hydration: normovolemia  Temperature: 36.0?C - 38.4?C  Airway patency: adequate    Perioperative Events       Post-op nausea and vomiting: no PONV    Postoperative Status  Cardiovascular status: hemodynamically stable  Respiratory status: spontaneous ventilation        Perioperative Events  There were no known complications for this encounter.

## 2023-03-11 NOTE — Patient Instructions
CARDIOLOGY PROCEDURES           POST SEDATION INSTRUCTIONS      Patient Name: Stanley Campbell  MRN#: [id number]  Date: 03/11/2023      Please follow the instructions listed below:    The following day you may experience a minor sore throat.    Please have someone accompany you, as YOU SHOULD NOT drive or operate machinery for at least 12-24 hours following the procedure.    There may be some residual effects from the sedatives during the procedure.  Do not drive a vehicle for up to 24 hours after receiving sedation.  Do not operate heavy or potentially harmful equipment  Do not make legally binding decisions  Do not drink alcohol for up to 24 hours  Do not communicate through social media for 24 hours.  Other instructions: resume regular diet and medications    If you have question or concerns about this procedure, please contact the Cardiology office at [phone], and ask to speak to one of the nurses.      Current Medications List:   5-hydroxytryptophan (5-HTP) (5-HTP PO) Take  by mouth at bedtime daily.    amLODIPine (NORVASC) 5 mg tablet Take one-half tablet by mouth daily.    apixaban (ELIQUIS) 5 mg tablet Take one tablet by mouth twice daily.    ascorbic acid (vitamin C) (VITAMIN C) 1,000 mg tablet Take one tablet by mouth daily.    aspirin (ASPIRIN CHILDRENS) 81 mg chewable tablet Chew one tablet by mouth daily. Indications: treatment to prevent a heart attack    BIOTIN PO Take  by mouth daily.    CALCIUM PO Take  by mouth daily.    cephalexin (KEFLEX) 500 mg capsule Take four capsules by mouth once for 1 dose. For SBE prophylaxis prior to dental procedure.  Indications: infection prophylaxis, medical    choline salicyl/mag salicylate (CHOLINE,MAGNESIUM SALICYLATE PO) Take  by mouth daily.    desvenlafaxine (KHEDEZLA) 100 mg ER tablet Take one tablet by mouth daily.    docusate (COLACE) 100 mg capsule Take one capsule by mouth at bedtime daily.    finasteride (PROSCAR) 5 mg tablet Take one tablet by mouth daily.    flaxseed oil (OMEGA 3 PO) Take  by mouth.    glucos sul 2KCl/msm/chond/C/Mn (GLUCOSAMINE CHONDROITIN PO) Take  by mouth daily.    LUTEIN PO Take 1 tablet by mouth daily.    metoprolol tartrate 25 mg tablet Take one tablet by mouth twice daily. Patient has been taking 0.5 tablet in the morning and 1 tablet in the evening    milk of magnesia (CONC) 2,400 mg/10 mL oral suspension Take  by mouth at bedtime daily.    SAW PALMETTO PO Take  by mouth daily.    tamsulosin (FLOMAX) 0.4 mg capsule Take one capsule by mouth daily. Do not crush, chew or open capsules. Take 30 minutes following the same meal each day.

## 2023-03-11 NOTE — Progress Notes
Pre-Operative Assessment for TEE or Cardioversion    Date of Service:  03/11/2023    Stanley Campbell is a 82 y.o. y.o. male. With significant PMH of Afib s/p Watchman, who is referred for TEE Indication: 45 day post watchman .      he has been compliant with his  apixaban (Eliquis) Missed dose: Noand ... bleeding. he is Positive for: GERD and Hx. Chest surgery/radiation and Positive for: Sleep Apnea/OSA.      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    Typical atrial flutter (HCC)         Surgical History:   Surgical History:   Procedure Laterality Date    UMBILICAL HERNIA REPAIR  2014    ABLATION OF DYSRHYTHMIC FOCUS  02/18/2017    INTRACARDIAC CATHETER ABLATION WITH COMPREHENSIVE ELECTROPHYSIOLOGIC EVALUATION - TYPICAL FLUTTER N/A 02/18/2017    Performed by Smith Robert, MD at [facility] EP LAB    PACEMAKER INSERTION Left 02/19/2017    INSERTION/ REPLACEMENT PERMANENT PACEMAKER WITH ATRIAL AND VENTRICULAR LEAD Left 02/19/2017    Performed by Miles Costain, MD at [facility] EP LAB    External Cardioversion N/A 02/19/2017    Performed by Miles Costain, MD at [facility] EP LAB    Removal Cardiac Event Recorder N/A 02/19/2017    Performed by Miles Costain, MD at [facility] EP LAB    INTRACARDIAC CATHETER ABLATION WITH COMPREHENSIVE ELECTROPHYSIOLOGIC EVALUATION - ATRIAL FIBRILLATION, CRYO N/A 10/11/2017    Performed by Smith Robert, MD at [facility] EP LAB    INTRACARDIAC ELECTROPHYSIOLOGIC 3-DIMENSIONAL MAPPING - PRECISION N/A 10/11/2017    Performed by Smith Robert, MD at [facility] EP LAB    INTRAVENOUS DRUG INFUSION FOR STIMULATION AND PACING N/A 10/11/2017    Performed by Smith Robert, MD at [facility] EP LAB    TRANSESOPHAGEAL ECHOCARDIOGRAM DURING INTERVENTION N/A 10/11/2017    Performed by Cath, Physician at [facility] EP LAB    PERCUTANEOUS CLOSURE LEFT ATRIAL APPENDAGE WITH ENDOCARDIAL IMPLANT N/A 01/24/2023    Performed by Julienne Kass, MD at [facility] CATH LAB    INTRACARDIAC ECHOCARDIOGRAPHY N/A 01/24/2023    Performed by Julienne Kass, MD at [facility] CATH LAB    CATARACT REMOVAL WITH IMPLANT Right     PROSTATE BIOPSY      SKIN CANCER EXCISION      TONSILLECTOMY         Social History     Social History     Tobacco Use    Smoking status: Never    Smokeless tobacco: Never   Vaping Use    Vaping status: Never Used   Substance Use Topics    Alcohol use: No    Drug use: No         Allergies  No Known Allergies       Current Medications  Current Outpatient Medications on File Prior to Encounter   Medication Sig Dispense Refill    5-hydroxytryptophan (5-HTP) (5-HTP PO) Take  by mouth at bedtime daily.      amLODIPine (NORVASC) 5 mg tablet Take one-half tablet by mouth daily. 90 tablet     apixaban (ELIQUIS) 5 mg tablet Take one tablet by mouth twice daily. 14 tablet 0    ascorbic acid (vitamin C) (VITAMIN C) 1,000 mg tablet Take one tablet by mouth daily.      aspirin (ASPIRIN CHILDRENS) 81 mg chewable tablet Chew one tablet by mouth daily. Indications: treatment to prevent a heart attack      BIOTIN PO Take  by mouth daily.      CALCIUM PO Take  by mouth daily.      cephalexin (KEFLEX) 500 mg capsule Take four capsules by mouth once for 1 dose. For SBE prophylaxis prior to dental procedure.  Indications: infection prophylaxis, medical 4 capsule 0    choline salicyl/mag salicylate (CHOLINE,MAGNESIUM SALICYLATE PO) Take  by mouth daily.      desvenlafaxine (KHEDEZLA) 100 mg ER tablet Take one tablet by mouth daily.      docusate (COLACE) 100 mg capsule Take one capsule by mouth at bedtime daily.      finasteride (PROSCAR) 5 mg tablet Take one tablet by mouth daily.      flaxseed oil (OMEGA 3 PO) Take  by mouth.      glucos sul 2KCl/msm/chond/C/Mn (GLUCOSAMINE CHONDROITIN PO) Take  by mouth daily.      LUTEIN PO Take 1 tablet by mouth daily.      metoprolol tartrate 25 mg tablet Take one tablet by mouth twice daily. Patient has been taking 0.5 tablet in the morning and 1 tablet in the evening      milk of magnesia (CONC) 2,400 mg/10 mL oral suspension Take  by mouth at bedtime daily.      SAW PALMETTO PO Take  by mouth daily.      tamsulosin (FLOMAX) 0.4 mg capsule Take one capsule by mouth daily. Do not crush, chew or open capsules. Take 30 minutes following the same meal each day.       No current facility-administered medications on file prior to encounter.       Vitals  Estimated body mass index is 26.45 kg/m? as calculated from the following:    Height as of an earlier encounter on 03/11/23: 182.9 cm (6').    Weight as of an earlier encounter on 03/11/23: 88.5 kg (195 lb).       Patient appears alert and oriented: Yes  NPO: for greater than 8 hours  Inpatient IV status:  22g RAC    Diagnostic Tests  White Blood Cells   Date Value Ref Range Status   01/10/2023 4.9 4.5 - 11.0 K/UL Final     Hemoglobin   Date Value Ref Range Status   01/10/2023 13.8 13.5 - 16.5 GM/DL Final     Hematocrit   Date Value Ref Range Status   01/10/2023 40.7 40 - 50 % Final     Platelet Count   Date Value Ref Range Status   01/10/2023 199 150 - 400 K/UL Final     Sodium   Date Value Ref Range Status   01/10/2023 139 137 - 147 MMOL/L Final     Potassium   Date Value Ref Range Status   01/10/2023 4.7  3.5 - 5.1 MMOL/L Final     Magnesium   Date Value Ref Range Status   01/10/2023 2.1 1.6 - 2.6 mg/dL Final     Blood Urea Nitrogen   Date Value Ref Range Status   01/10/2023 27 (H) 7 - 25 MG/DL Final     Creatinine   Date Value Ref Range Status   01/10/2023 1.12 0.4 - 1.24 MG/DL Final     Glucose   Date Value Ref Range Status   01/10/2023 85 70 - 100 MG/DL Final   56/21/3086 99 65 - 99 mg/dL Final     Comment:                   Fasting reference interval            Last MAC INR Flow Sheet Entry:    Last recorded Lab results:   INR   Date Value Ref Range Status   02/22/2017 1.6 (H) 0.8 - 1.2 Final   02/21/2017 1.5 (H) 0.8 - 1.2 Final   02/20/2017 1.2 0.8 - 1.2 Final   12/18/2016 1.2 0.8 - 1.2 Final     INR POC   Date Value Ref Range Status   01/24/2023 1.0 0.8 - 1.2 Final   10/11/2017 1.1 0.8 - 1.2 Final     No results found for: PTT        Blood Cultures  Resulted Micro Last 72 Hrs    No results found         Last TEE date: None  Last Cardioversion date: None  Echo procedures within the past 30 days:  No results found.      Device Information on File  Lab Results   Component Value Date/Time    GENERATOR Medtronic 12/17/2022 09:05 AM    EPDEVTYP IPG 12/17/2022 09:05 AM         Additional Comments:  None    Plan:  Dr. Laverda Sorenson will plan to proceed with the  TEE.

## 2023-03-11 NOTE — Progress Notes
No evidence of thrombosis  No evidence of significant leak  Can stop eliquis permanently  Switch to aspirin and plavix x 4.5 more months  Then aspirin 81 for life   Thanks

## 2023-03-12 ENCOUNTER — Encounter: Admit: 2023-03-12 | Discharge: 2023-03-12 | Payer: MEDICARE

## 2023-03-12 DIAGNOSIS — Z950 Presence of cardiac pacemaker: Secondary | ICD-10-CM

## 2023-03-12 DIAGNOSIS — I480 Paroxysmal atrial fibrillation: Secondary | ICD-10-CM

## 2023-03-12 DIAGNOSIS — R0989 Other specified symptoms and signs involving the circulatory and respiratory systems: Secondary | ICD-10-CM

## 2023-03-12 DIAGNOSIS — Z95818 Presence of other cardiac implants and grafts: Secondary | ICD-10-CM

## 2023-03-12 NOTE — Telephone Encounter
-----   Message from Olen Pel, MD sent at 03/11/2023  3:59 PM CDT -----  No evidence of thrombosis  No evidence of significant leak  Can stop eliquis permanently  Switch to aspirin and plavix x 4.5 more months  Then aspirin 81 for life   Thanks

## 2023-03-13 MED ORDER — CLOPIDOGREL 75 MG PO TAB
75 mg | ORAL_TABLET | Freq: Every day | ORAL | 1 refills | 90.00000 days | Status: DC
Start: 2023-03-13 — End: 2023-03-27

## 2023-03-13 NOTE — Telephone Encounter
Reviewed TEE results with patient's wife, [name]. We also discussed stopping the eliquis permanently once patient has his prescription of plavix and to continue to taking aspirin daily and she verbalized understanding.

## 2023-03-23 ENCOUNTER — Encounter: Admit: 2023-03-23 | Discharge: 2023-03-23 | Payer: MEDICARE

## 2023-03-27 ENCOUNTER — Encounter: Admit: 2023-03-27 | Discharge: 2023-03-27 | Payer: MEDICARE

## 2023-03-27 MED ORDER — CLOPIDOGREL 75 MG PO TAB
75 mg | ORAL_TABLET | Freq: Every day | ORAL | 3 refills | 90.00000 days | Status: AC
Start: 2023-03-27 — End: ?

## 2023-03-27 NOTE — Telephone Encounter
Refill request received from [facility] for Plavix. Approved supply for 4 months since patient will only be on it for the next 4.5 months.

## 2023-04-22 ENCOUNTER — Encounter: Admit: 2023-04-22 | Discharge: 2023-04-22 | Payer: MEDICARE

## 2023-04-22 ENCOUNTER — Ambulatory Visit: Admit: 2023-04-22 | Discharge: 2023-04-22 | Payer: MEDICARE

## 2023-04-22 DIAGNOSIS — F419 Anxiety disorder, unspecified: Secondary | ICD-10-CM

## 2023-04-22 DIAGNOSIS — I10 Essential (primary) hypertension: Secondary | ICD-10-CM

## 2023-04-22 DIAGNOSIS — Z95818 Presence of other cardiac implants and grafts: Secondary | ICD-10-CM

## 2023-04-22 DIAGNOSIS — I495 Sick sinus syndrome: Secondary | ICD-10-CM

## 2023-04-22 DIAGNOSIS — Z950 Presence of cardiac pacemaker: Secondary | ICD-10-CM

## 2023-04-22 DIAGNOSIS — I483 Typical atrial flutter: Secondary | ICD-10-CM

## 2023-04-22 DIAGNOSIS — C801 Malignant (primary) neoplasm, unspecified: Secondary | ICD-10-CM

## 2023-04-22 DIAGNOSIS — N400 Enlarged prostate without lower urinary tract symptoms: Secondary | ICD-10-CM

## 2023-04-22 DIAGNOSIS — I480 Paroxysmal atrial fibrillation: Secondary | ICD-10-CM

## 2023-04-22 DIAGNOSIS — C4491 Basal cell carcinoma of skin, unspecified: Secondary | ICD-10-CM

## 2023-04-22 DIAGNOSIS — I4891 Unspecified atrial fibrillation: Secondary | ICD-10-CM

## 2023-04-22 DIAGNOSIS — H9190 Unspecified hearing loss, unspecified ear: Secondary | ICD-10-CM

## 2023-04-22 DIAGNOSIS — G4730 Sleep apnea, unspecified: Secondary | ICD-10-CM

## 2023-04-22 MED ORDER — AMLODIPINE 5 MG PO TAB
5 mg | ORAL_TABLET | Freq: Every day | ORAL | 3 refills | Status: AC
Start: 2023-04-22 — End: ?

## 2023-04-22 NOTE — Progress Notes
Cardiovascular Medicine     Date of Service: 04/22/2023    HPI     I had the pleasure of seeing Stanley Campbell for a followup visit in the Cardiovascular Medicine Clinic at [facility].     [name] as you may recall is a 82 y.o. male patient of Dr. Barry Dienes and Dr. 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    He presents to clinic today with concerns for hypertension. Home BP readings have been consistently > 140 mmHg with systolic readings up to the 190s mmHg. BP in office today 188/112 mmHg. His antihypertensive regimen was reduced in the past d/t hypotension. He continues amlodipine 2.5 mg daily and metoprolol tartrate 12.5 mg am and 25 mg in the evening for hypertension. He remains asymptomatic with hypertensive episodes. He denies headaches, chest pain/discomfort, dyspnea, palpitations, orthopnea, PND, lower extremity edema, presyncope, and syncope. BP in office today 188/112.     He brought home BP cuff for calibration which reveals readings within 10 mmHg to our in office readings.           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  Neck Veins: neck veins are flat & not distended  Carotid Arteries: no bruits  Chest Inspection: chest is normal in appearance  Auscultation/Percussion: lungs clear to auscultation, no rales, rhonchi, or wheezing  Cardiac Rhythm: regular rhythm & normal rate  Cardiac Auscultation: Normal S1 & S2.    Murmurs: no cardiac murmurs   Extremities: no lower extremity edema; 2+ symmetric distal pulses  Neurologic Exam: oriented to time, place and person; no focal neurologic deficits  Psychiatric: Normal mood and affect.  Behavior is normal. Judgment and thought content normal.        Cardiovascular Studies    Most recent results for 12-Lead ECG   ECG 12-LEAD    Collection Time: 03/11/23  8:34 AM   Result Value Status    VENTRICULAR RATE 70 Final    P-R INTERVAL 184 Final    QRS DURATION 110 Final    Q-T INTERVAL 384 Final    QTC CALCULATION (BAZETT) 414 Final    P AXIS 50 Final    R AXIS -12 Final    T AXIS 11 Final  Impression    Normal sinus rhythm  Normal ECG  When compared with ECG of 10-Jan-2023 10:46,  No significant change was found  Confirmed by Idamae Lusher ([id number]) on 03/11/2023 3:43:39 PM      Cardiovascular Health Factors  Vitals BP Readings from Last 3 Encounters:   04/22/23 (!) 191/109   03/11/23 128/83   03/11/23 (!) 165/99     Wt Readings from Last 3 Encounters:   04/22/23 86.3 kg (190 lb 3.2 oz)   03/11/23 88.5 kg (195 lb)   03/11/23 88.5 kg (195 lb)     BMI Readings from Last 3 Encounters:   04/22/23 25.80 kg/m?   03/11/23 26.45 kg/m?   03/11/23 26.45 kg/m?      Smoking Social History     Tobacco Use   Smoking Status Never   Smokeless Tobacco Never      Lipid Profile Cholesterol   Date Value Ref Range Status   07/26/2022 206 (H) <200 Final     HDL   Date Value Ref Range Status   07/26/2022 76  Final     LDL   Date Value Ref Range Status   07/26/2022 121 (H) <100 Final     Triglycerides   Date Value Ref Range Status   07/26/2022 48  Final      Blood Sugar No results found for: HGBA1C  Glucose   Date Value Ref Range Status   01/10/2023 85 70 - 100 MG/DL Final   52/84/1324 94  Final   04/27/2021 95  Final   02/04/2017 99 65 - 99 mg/dL Final     Comment:                   Fasting reference interval          Glucose, POC   Date Value Ref Range Status   01/24/2023 82 70 - 100 MG/DL Final          Problems Addressed Today  Encounter Diagnoses   Name Primary?    Hypertension, unspecified type Yes    Paroxysmal atrial fibrillation (HCC)     Presence of cardiac pacemaker     Presence of Watchman left atrial appendage closure device        Assessment and Plan     Hypertension:  BP in office today:  BP BP Readings from Last 3 Encounters:   04/22/23 (!) 191/109   03/11/23 128/83   03/11/23 (!) 165/99      - BP in office today 188/112.   - Home BP readings have been consistently > 140 mmHg with systolic readings up to the 190s mmHg. BP in office today 188/112 mmHg. His antihypertensive regimen was reduced in the past d/t hypotension. He continues amlodipine 2.5 mg daily and metoprolol tartrate 12.5 mg am and 25 mg in the evening for hypertension. He remains asymptomatic with hypertensive episodes.   - Will increase his amlodipine to 5 mg daily and increase his metoprolol tartrate to 25 mg twice daily.  - We discussed proper technique for home monitoring. His home BP cuff consistent with in office readings. I encouraged ongoing home BP monitoring and documentation on a log. I have asked that nursing contact him on Friday 04/26/23 to assess home BP readings following increase in antihypertensive regimen.    Paroxysmal atrial fibrillation  S/p Watchman on 01/24/23 with Dr. 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 Two episodes of paroxysmal atrial tachycardia lasting 1 second each.       Follow Up Visit:  Follow up with Dr. Riley Nearing as scheduled in December, 2024 and with Dr. Barry Dienes for annual visit in March, 2025.      I have personally documented the HPI, exam and medical decision making. I have instructed the patient on the plan of care and they verbalize understanding of the plan. Please see AVS for full patient teaching. Patient advised to call our office or message [place] via My Chart if s/he has any problems, questions, worsening symptoms, or concerns prior to the next appointment.        Thank you for allowing me to participate in the care of this patient. If you have any questions please do not hesitate to contact our office.     This note was in part completed with Dragon, a speech recognition software.  Some grammatical and transcription errors may have occurred.  If you have any concern, please contact the the our office for clarification.     Wynelle Link, FNP, APRN, FNP-BC  Collaborating Physician: Dr. Shela Commons. Kvapil/ Dr. Ihor Austin  Cardiovascular Medicine at [facility]  Phone: [phone]           Current Medications (including today's revisions)   5-hydroxytryptophan (5-HTP) (5-HTP PO) Take  by mouth at bedtime daily.    amLODIPine (NORVASC) 5 mg tablet Take one tablet by mouth daily.    ascorbic acid (vitamin C) (VITAMIN C) 1,000 mg tablet Take one tablet by mouth daily.    aspirin (ASPIRIN CHILDRENS) 81 mg chewable tablet Chew one tablet by mouth daily. Indications: treatment to prevent a heart attack    BIOTIN PO Take  by mouth daily.    CALCIUM PO Take  by mouth daily.    choline salicyl/mag salicylate (CHOLINE,MAGNESIUM SALICYLATE PO) Take  by mouth daily.    clopiDOGreL (PLAVIX) 75 mg tablet Take one tablet by mouth daily.    desvenlafaxine (KHEDEZLA) 100 mg ER tablet Take one tablet by mouth daily.    docusate (COLACE) 100 mg capsule Take one capsule by mouth at bedtime daily.    finasteride (PROSCAR) 5 mg tablet Take one tablet by mouth daily.    flaxseed oil (OMEGA 3 PO) Take  by mouth.    glucos sul 2KCl/msm/chond/C/Mn (GLUCOSAMINE CHONDROITIN PO) Take  by mouth daily.    LUTEIN PO Take 1 tablet by mouth daily.    metoprolol tartrate 25 mg tablet Take one tablet by mouth twice daily.    milk of magnesia (CONC) 2,400 mg/10 mL oral suspension Take  by mouth at bedtime daily.    SAW PALMETTO PO Take  by mouth daily.    tamsulosin (FLOMAX) 0.4 mg capsule Take one capsule by mouth daily. Do not crush, chew or open capsules. Take 30 minutes following the same meal each day.

## 2023-04-24 ENCOUNTER — Ambulatory Visit: Admit: 2023-04-24 | Discharge: 2023-04-24 | Payer: MEDICARE

## 2023-04-24 ENCOUNTER — Encounter: Admit: 2023-04-24 | Discharge: 2023-04-24 | Payer: MEDICARE

## 2023-04-24 DIAGNOSIS — Z950 Presence of cardiac pacemaker: Secondary | ICD-10-CM

## 2023-04-26 ENCOUNTER — Encounter: Admit: 2023-04-26 | Discharge: 2023-04-26 | Payer: MEDICARE

## 2023-04-26 NOTE — Telephone Encounter
Changes From Today's Office Visit  Will increase your amlodipine to 5 mg daily and metoprolol tartrate to 25 mg twice daily  Continue to monitor your blood pressure daily and keep log  Will call you on Friday 9/27 to evaluate your readings on increased doses of medications prior to you leaving out of town.    LVM calling to follow up on pts blood pressures per OV note on 9/23 with medication changes.

## 2023-05-01 ENCOUNTER — Encounter: Admit: 2023-05-01 | Discharge: 2023-05-01 | Payer: MEDICARE

## 2023-05-17 ENCOUNTER — Encounter: Admit: 2023-05-17 | Discharge: 2023-05-17 | Payer: MEDICARE

## 2023-05-23 ENCOUNTER — Encounter: Admit: 2023-05-23 | Discharge: 2023-05-23 | Payer: MEDICARE

## 2023-05-23 NOTE — Telephone Encounter
Pt's wife is returning a call we made to them regarding patient's blood pressure.

## 2023-05-23 NOTE — Telephone Encounter
Call made to patient's wife, who reports patient's blood pressure is doing better.  She reports blood pressures as follows:    10/9 7:50PM 134/86 74  10/10 8:39PM 115/77 75  10/12 8:00PM 110/73 73  10/14 7:28PM 131/78 69   10/19 12:52PM 118/78 72  10/20 9:04PM 123/81 70     Most of these blood pressures have already been reviewed and patient was advised to continue to monitor.  I instructed patient's wife to have patient continue to monitor blood pressure and let us know if the numbers start to increase or if patient experiences any new or concerning symptoms.  Understanding verbalized.

## 2023-06-17 ENCOUNTER — Ambulatory Visit: Admit: 2023-06-17 | Discharge: 2023-06-18 | Payer: MEDICARE

## 2023-06-19 ENCOUNTER — Encounter: Admit: 2023-06-19 | Discharge: 2023-06-19 | Payer: MEDICARE

## 2023-07-16 ENCOUNTER — Encounter: Admit: 2023-07-16 | Discharge: 2023-07-16 | Payer: MEDICARE

## 2023-07-17 ENCOUNTER — Encounter: Admit: 2023-07-17 | Discharge: 2023-07-17 | Payer: MEDICARE

## 2023-07-17 ENCOUNTER — Ambulatory Visit: Admit: 2023-07-17 | Discharge: 2023-07-18 | Payer: MEDICARE

## 2023-07-17 DIAGNOSIS — Z95818 Presence of other cardiac implants and grafts: Secondary | ICD-10-CM

## 2023-07-17 DIAGNOSIS — I480 Paroxysmal atrial fibrillation: Secondary | ICD-10-CM

## 2023-07-17 DIAGNOSIS — Z9189 Other specified personal risk factors, not elsewhere classified: Secondary | ICD-10-CM

## 2023-07-17 NOTE — Patient Instructions
Thank you for visiting our office today.    We would like to make the following medication adjustments:      STOP Plavix       Otherwise continue the same medications as you have been doing.          We will be pursuing the following tests after your appointment today:            We will plan to see you back in 12 months.  Please call us in the meantime with any questions or concerns.        Please allow 5-7 business days for our providers to review your results. All normal results will go to MyChart. If you do not have Mychart, it is strongly recommended to get this so you can easily view all your results. If you do not have mychart, we will attempt to call you once with normal lab and testing results. If we cannot reach you by phone with normal results, we will send you a letter.  If you have not heard the results of your testing after one week please give [place] a call.       Your Cardiovascular Medicine [place] [place] [facility] Team Brett Canales, Pilar Jarvis, Shawna Orleans, and Jennings)  phone number is [phone].

## 2023-07-17 NOTE — Progress Notes
CARDIOLOGY PROGRESS NOTE    Stanley Campbell                      Events  Hemodynamic parameters: Elevated blood pressure normal heart rate noted in our office today.  He states that he is highly anxious coming to the doctor's office.  Home blood pressure readings are well within normal limits.    Subjective:  No TIA or stroke or bleeding complications  Tolerates aspirin and Plavix well  Multiple skin bruises noted.    Assessment & Recs     1.  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  Paroxysmal nonvalvular atrial fibrillation  3.  High bleeding risk and high stroke risk  4.  Whitecoat syndrome    Patient underwent transcatheter left atrial appendage occlusion with Watchman FLX Pro # 35 mm device implanted on January 24, 2023 with intracardiac echocardiography guidance.  Patient presents for 62-month postprocedural visit.  Clinical indications include paroxysmal nonvalvular atrial fibrillation with underlying high bleeding risk and high stroke risk which shared decision making for a rationale of alternatives to long-term oral anticoagulation with transcatheter left atrial appendage occlusion.  No evidence of TIA or stroke in the interim.  No major bleeding complications    Recommendations     1.  Results of the 45-day TEE and images were reviewed and my independent interpretation is as follows: Normal ejection fraction noted, normal RV function.  Device appears to be well-seated in the left atrial appendage.  No device related thrombus noted.  No peridevice leak of clinically significant magnitude noted.  Overall, effective closure of the left atrial appendage has been noted based upon the current study  2.  Recommend stopping Plavix permanently at the 15-month post implant mark, and continuing aspirin 81 mg daily lifelong.  3.  No significant change in management of atrial fibrillation-follow-up with primary cardiologist.  4. Follow-up per watchman protocol with 1 year postprocedural TEE.  Watchman team will be in touch with the patient.    Jonell Cluck, MD, [facility], [facility]  Interventional Cardiologist    Medications  Current medications reviewed    Review Of Systems  None besides facts mentioned above      Physical Exam                          Vital Signs: Most Recent   Vitals:    07/17/23 1015   BP: (!) 151/102   BP Source: Arm, Left Upper   Pulse: 64   O2 Device: None (Room air)   PainSc: Zero   Weight: 89.7 kg (197 lb 12.8 oz)   Height: 182.9 cm (6')          General Appearance: moderately overweight, no distress   Neck Veins: normal JVP , neck veins are not distended   Cardiovascular system: Pulse 86/min, regular rhythm , normal volume, no specific character, felt equally in all peripheries and there was no radio femoral delay.  PMI undisplaced, no palpable thrills/heaves, S1 S2 heard, no murmurs, no rub, no jugular venous distension, no carotid bruit.  Peripheral Circulation: normal peripheral circulation   Pedal Pulses: normal symmetric pedal pulses   Carotid Arteries: normal carotid upstroke bilaterally, no bruits   Respiratory system: No acute distress  No use of accessory muscles  Normal vesicular breath sounds over all the lung fields bilaterally  No added  sounds      Lab/Radiology/Other Diagnostic Tests:    Pertinent labs, cardiac imaging and EKG reviewed.    Total Time Today was 40 minutes in the following activities: Preparing to see the patient, Obtaining and/or reviewing separately obtained history, Performing a medically appropriate examination and/or evaluation, Counseling and educating the patient/family/caregiver, Ordering medications, tests, or procedures, Referring and communication with other health care professionals (when not separately reported), Documenting clinical information in the electronic or other health record, Independently interpreting results (not separately reported) and communicating results to the patient/family/caregiver, and Care coordination (not separately reported)    This note was dictated using the dragon speech recognition software.  Transcription errors may occur with the use of this software. Editing and proofreading were done by the author of this document.  In spite of the author's best effort to identify every error introduced by voice to text dictation, some errors that may represent misspelling or misstatements of what was dictated may persist.  If there are questions about content in this document please contact Jonell Cluck MD

## 2023-08-05 ENCOUNTER — Encounter: Admit: 2023-08-05 | Discharge: 2023-08-05 | Payer: MEDICARE

## 2023-08-05 NOTE — Progress Notes
CT Limited Chest dated 11/30/2022 sent to PCP on file. Report recommends a CT Chest.

## 2023-09-16 ENCOUNTER — Ambulatory Visit: Admit: 2023-09-16 | Discharge: 2023-09-17 | Payer: MEDICARE

## 2023-10-14 ENCOUNTER — Encounter: Admit: 2023-10-14 | Discharge: 2023-10-14 | Payer: MEDICARE

## 2023-10-14 NOTE — Assessment & Plan Note
 Amlodipine 5/d, metoprolol tartrate 25 BID.

## 2023-10-14 NOTE — Assessment & Plan Note
 DDDR PPM implanted in 2018 for sick sinus syndrome.      Last remote 09/16/23 showed about 35% RA pacing, estimated battery longevity > 8 years.

## 2023-10-14 NOTE — Assessment & Plan Note
 AF ablation in 2019.  Pacemaker serves as a monitoring system--no AF recurrence.    Watchman implant 12/2022, of anti-coagulation now.

## 2023-10-15 ENCOUNTER — Encounter: Admit: 2023-10-15 | Discharge: 2023-10-15 | Payer: MEDICARE

## 2023-10-15 ENCOUNTER — Ambulatory Visit: Admit: 2023-10-15 | Discharge: 2023-10-16 | Payer: MEDICARE

## 2023-10-15 DIAGNOSIS — I10 Essential (primary) hypertension: Secondary | ICD-10-CM

## 2023-10-15 DIAGNOSIS — G4752 REM sleep behavior disorder: Secondary | ICD-10-CM

## 2023-10-15 DIAGNOSIS — I480 Paroxysmal atrial fibrillation: Secondary | ICD-10-CM

## 2023-10-15 DIAGNOSIS — Z950 Presence of cardiac pacemaker: Secondary | ICD-10-CM

## 2023-10-15 NOTE — Progress Notes
 Date of Service: 10/15/2023    Stanley Campbell is a 83 y.o. male.       HPI     Stanley Campbell was in the [place] clinic today with his wife.  Their daughter, Stanley Campbell, is one of the ICU nurses here at [facility].  When I last saw him in March, 2024, we made arrangements for a Watchman.  This was done about a month later and Stanley Campbell is now off anti-coagulation for his PAF.    He was have a sort of behavioral sleep disorder that turned out to be OSA.  He's on CPAP and doing better.  His wife is still sleeping in another room due to the sound of the CPAP machine.     He's not having any cardiac symptoms--no chest pain, dyspnea, or palpitations.  He denies any TIA or stroke symptoms.         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   HENT: Negative.     Eyes: Negative.    Cardiovascular: Negative.    Respiratory: Negative.     Endocrine: Negative.    Hematologic/Lymphatic: Negative.    Skin: Negative.    Musculoskeletal: Negative.    Gastrointestinal: Negative.    Genitourinary: Negative.    Neurological: Negative.    Psychiatric/Behavioral: Negative.     Allergic/Immunologic: Negative.        Physical Exam    Physical Exam   General Appearance: no distress   Skin: warm, no ulcers or xanthomas   Digits and Nails: no cyanosis or clubbing   Eyes: conjunctivae and lids normal, pupils are equal and round Teeth/Gums/Palate: dentition unremarkable, no lesions   Lips & Oral Mucosa: no pallor or cyanosis   Neck Veins: normal JVP , neck veins are not distended   Thyroid: no nodules, masses, tenderness or enlargement   Chest Inspection: chest is normal in appearance   Respiratory Effort: breathing comfortably, no respiratory distress   Auscultation/Percussion: lungs clear to auscultation, no rales or rhonchi, no wheezing   PMI: PMI not enlarged or displaced   Cardiac Rhythm: regular rhythm and normal rate   Cardiac Auscultation: S1, S2 normal, no rub, no gallop   Murmurs: no murmur   Peripheral Circulation: normal peripheral circulation   Carotid Arteries: normal carotid upstroke bilaterally, no bruits   Radial Arteries: normal symmetric radial pulses   Abdominal Aorta: no abdominal aortic bruit   Pedal Pulses: normal symmetric pedal pulses   Lower Extremity Edema: no lower extremity edema   Abdominal Exam: soft, non-tender, no masses, bowel sounds normal   Liver & Spleen: no organomegaly   Gait & Station: walks without assistance   Muscle Strength: normal  muscle tone   Orientation: oriented to time, place and person   Affect & Mood: appropriate and sustained affect   Language and Memory: patient responsive and seems to comprehend information   Neurologic Exam: neurological assessment grossly intact   Other: moves all extremities      Cardiovascular Health Factors  Vitals BP Readings from Last 3 Encounters:   10/15/23 123/76   07/17/23 (!) 151/102   04/22/23 (!) 191/109     Wt Readings from Last 3 Encounters:   10/15/23 87.7 kg (193 lb 6.4 oz)   07/17/23 89.7 kg (197 lb 12.8 oz)   04/22/23 86.3 kg (190 lb 3.2 oz)     BMI Readings from Last 3 Encounters:   10/15/23 26.23 kg/m?   07/17/23 26.83 kg/m?   04/22/23 25.80 kg/m?      Smoking Social History     Tobacco Use   Smoking Status Never   Smokeless Tobacco Never      Lipid Profile Cholesterol   Date Value Ref Range Status   07/26/2022 206 (H) <200 Final     HDL   Date Value Ref Range Status   07/26/2022 76  Final     LDL   Date Value Ref Range Status   07/26/2022 121 (H) <100 Final     Triglycerides   Date Value Ref Range Status   07/26/2022 48  Final      Blood Sugar No results found for: HGBA1C  Glucose   Date Value Ref Range Status   01/10/2023 85 70 - 100 MG/DL Final   55/73/2202 94  Final   04/27/2021 95  Final     Glucose, POC   Date Value Ref Range Status   01/24/2023 82 70 - 100 MG/DL Final          Problems Addressed Today  Encounter Diagnoses   Name Primary?    Paroxysmal atrial fibrillation (CMS-HCC) Yes    Primary hypertension     Presence of cardiac pacemaker     Uncontrolled REM sleep behavior disorder        Assessment and Plan       Atrial fibrillation (CMS-HCC)  AF ablation in 2019.  Pacemaker serves as a monitoring system--no AF recurrence.    Watchman implant 12/2022, of anti-coagulation now.    Hypertension  Amlodipine 5/d, metoprolol tartrate 25 BID.    Presence of cardiac pacemaker  DDDR PPM implanted in 2018 for sick sinus syndrome.      Last remote 09/16/23 showed about 35% RA pacing, estimated battery longevity > 8 years.    Uncontrolled REM sleep behavior disorder  Symptoms improved on CPAP.    Current Medications (including today's revisions)   5-hydroxytryptophan (5-HTP) (5-HTP PO) Take  by mouth at bedtime daily.    amLODIPine (NORVASC) 5 mg tablet Take one tablet by mouth daily.    ascorbic acid (vitamin C) (VITAMIN C) 1,000 mg tablet Take one tablet by mouth daily.    aspirin (ASPIRIN CHILDRENS) 81 mg chewable tablet Chew one tablet by mouth daily. Indications: treatment to prevent a heart attack    BIOTIN PO Take  by mouth daily.    CALCIUM PO Take  by mouth daily.    desvenlafaxine succinate (PRISTIQ) 100 mg tablet Take one tablet by mouth daily.    docusate (COLACE) 100 mg capsule Take one capsule by mouth at bedtime daily.    ergocalciferol (vitamin D2) (VITAMIN D PO) Take  by mouth.    finasteride (PROSCAR) 5  mg tablet Take one tablet by mouth daily.    flaxseed oil (OMEGA 3 PO) Take  by mouth.    glucos sul 2KCl/msm/chond/C/Mn (GLUCOSAMINE CHONDROITIN PO) Take  by mouth daily.    LUTEIN PO Take 1 tablet by mouth daily.    metoprolol tartrate 25 mg tablet Take one tablet by mouth twice daily.    milk of magnesia (CONC) 2,400 mg/10 mL oral suspension Take  by mouth at bedtime daily.    psyllium seed (with sugar) (FIBER PO) Take  by mouth.    SAW PALMETTO PO Take  by mouth daily.    tamsulosin (FLOMAX) 0.4 mg capsule Take one capsule by mouth daily. Do not crush, chew or open capsules. Take 30 minutes following the same meal each day.     Total time spent on today's office visit was 30 minutes.  This includes face-to-face in person visit with patient as well as nonface-to-face time including review of the EMR, outside records, labs, radiologic studies, echocardiogram & other cardiovascular studies, formation of treatment plan, after visit summary, future disposition, and lastly on documentation.

## 2023-12-12 ENCOUNTER — Encounter: Admit: 2023-12-12 | Discharge: 2023-12-12 | Payer: MEDICARE

## 2023-12-16 ENCOUNTER — Encounter: Admit: 2023-12-16 | Discharge: 2023-12-16 | Payer: MEDICARE

## 2023-12-16 DIAGNOSIS — I480 Paroxysmal atrial fibrillation: Secondary | ICD-10-CM

## 2023-12-16 DIAGNOSIS — Z95818 Presence of other cardiac implants and grafts: Secondary | ICD-10-CM

## 2023-12-16 MED ORDER — LIDOCAINE 5 % TP OINT
Freq: Once | TOPICAL | 0 refills
Start: 2023-12-16 — End: ?

## 2023-12-17 ENCOUNTER — Encounter: Admit: 2023-12-17 | Discharge: 2023-12-17 | Payer: MEDICARE

## 2023-12-17 DIAGNOSIS — Z950 Presence of cardiac pacemaker: Secondary | ICD-10-CM

## 2024-01-15 ENCOUNTER — Encounter: Admit: 2024-01-15 | Discharge: 2024-01-15 | Payer: MEDICARE

## 2024-01-16 NOTE — Progress Notes
 Date of Service: 01/22/2024      Stanley Campbell is a 83 y.o. male patient of Dr. Quin with a PMH of paroxysmal afib s/p watchman, hypertension, BPH, and anxiety. He is accompanied by his wife [name]. I am seeing him in the office today for updated history and physical prior to 1 year watchman TEE.         HPI     Stanley Campbell routinely follows with Dr. Quin and was referred to Dr. Vertie in April of 2024 for consideration of left atrial appendage occlusion with a watchman device for frequent falls. He was felt to be an appropriate candidate and underwent successful closure with a 35mm Watchman Flex PRO device on 01/24/2023 with Dr. Gunasekaran Gunasekaran. His 45 day TEE did not reveal a peridevice leak or thrombus, and his anticoagulation was switched to DAPT for the remaining 4.5 months. He is now on aspirin  monotherapy.     He presents today for updated history and physician prior to 1 year TEE. he reports he has been doing well. 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     He denies loose teeth, trouble swallowing, known ulceration of the esophagus or hiatal hernia, and recent chest trauma.  His wife [name] will be his ride to and from procedure today.    He denies chest pain, palpitations, shortness of breath, syncope, near syncope, orthopnea, paroxysmal nocturnal dyspnea, and lower extremity edema.           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   All other systems reviewed and are negative.      Physical Exam  Gen: well appearing 83 y.o. year old male, in no acute distress  Eyes: sclera non-icteric, EOMs intact   Neck: no JVD, no carotid bruits auscultated  Lungs: CTA bilaterally without rales or rhonchi  Heart: RRR without murmur, no gallop appreciated  Abdomen: soft, nontender, bowel sounds present  Extremities: no lower extremity edema, no cyanosis, pedal and posterior tibial pulses are palpable. Feet are warm, with preserved capillary refill.   Skin: warm and dry  Neurological: A&Ox3, no focal deficits noted  Psychiatric: calm, pleasant, and cooperative        Cardiovascular Studies    Watchman Device Implant 01/24/2023  CONCLUSION: Successful Watchman FLX Pro Device Implantation 35 mm meeting PASS criteria.   Plan:  Oral anticoagulation plus aspirin  81 mg daily for a total of 6 weeks followed by repeat TEE to assess adequate seal.  If no peridevice leak more than 5 mm noted  on the 6-week TEE, recommend coming off of oral anticoagulation and transition to aspirin  81 mg daily + Plavix  75 daily for up to 6 months after device implantation followed by which aspirin  monotherapy 81 mg daily is recommended      TEE 03/11/2023  Well-seated 35 mm WATCHMAN FLX? Pro left atrial appendage closure device present.  No device related thrombus. 2 small peridevice leaks noted, both with vena contracta of 2 mm.   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  Normal left ventricular size and  systolic function with estimated ejection fraction of 60%.  Dilated right ventricle with normal right ventricular systolic function.  Device leads present in right heart.  Mild to moderate tricuspid regurgitation.  No other significant valvular regurgitation or stenosis.  No pericardial effusion.      Cardiovascular Health Factors  Vitals BP Readings from Last 3 Encounters:   01/22/24 (!) 146/96   10/15/23 123/76   07/17/23 (!) 151/102     Wt Readings from Last 3 Encounters:   01/22/24 85 kg (187 lb 4.8 oz)   10/15/23 87.7 kg (193 lb 6.4 oz)   07/17/23 89.7 kg (197 lb 12.8 oz)     BMI Readings from Last 3 Encounters:   01/22/24 25.40 kg/m?   10/15/23 26.23 kg/m?   07/17/23 26.83 kg/m?      Smoking Social History     Tobacco Use   Smoking Status Never   Smokeless Tobacco Never      Lipid Profile Cholesterol   Date Value Ref Range Status   07/26/2022 206 (H) <200 Final     HDL   Date Value Ref Range Status   07/26/2022 76  Final     LDL   Date Value Ref Range Status   07/26/2022 121 (H) <100 Final     Triglycerides   Date Value Ref Range Status   07/26/2022 48  Final      Blood Sugar No results found for: HGBA1C  Glucose   Date Value Ref Range Status   01/10/2023 85 70 - 100 MG/DL Final   87/71/7976 94  Final   04/27/2021 95  Final     Glucose, POC   Date Value Ref Range Status   01/24/2023 82 70 - 100 MG/DL Final          Problems Addressed Today  Encounter Diagnoses   Name Primary?    Paroxysmal atrial fibrillation (CMS-HCC) Yes    Presence of Watchman left atrial appendage closure device     Primary hypertension     Presence of cardiac pacemaker     Cardiovascular symptoms        Assessment and Plan     Paroxysmal Atrial Fibrillation.   S/p LAAO with a watchman device on 01/24/2023 with Dr. Gunasekaran. He is on aspirin  monotherapy.   He is scheduled to undergo 1 year surveillance TEE following our visit. 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 I see no reason why he cannot proceed with this imaging study as scheduled.    Hypertension.   BP today 146/96. Goal BP < 130/80.   Given his intermittent dizziness, we will not change his BP regimen today. Continue Norvasc  and Lopressor .     PPM in situ.  Implanted in 2018 d/t sick sinus syndrome.        Follow Up: With Dr. Quin' office annually  as requested.          Current Medications (including today's revisions)   5-hydroxytryptophan (5-HTP) (5-HTP PO) Take  by mouth at bedtime daily. (Patient not taking: Reported on 01/22/2024)    amLODIPine  (NORVASC ) 5 mg tablet Take one tablet by mouth daily.    ascorbic acid (vitamin C) (VITAMIN C) 1,000 mg tablet Take one tablet by mouth daily.    aspirin  (ASPIRIN  CHILDRENS) 81 mg chewable tablet Chew one tablet by mouth daily. Indications: treatment to prevent a heart attack    BIOTIN PO Take  by mouth daily.    CALCIUM PO Take  by mouth daily.    desvenlafaxine succinate (PRISTIQ) 100 mg tablet Take one tablet by mouth daily.    docusate (COLACE) 100 mg capsule Take one capsule by mouth at bedtime daily.    ergocalciferol (vitamin D2) (VITAMIN D PO) Take  by mouth.    finasteride  (PROSCAR ) 5 mg tablet Take one tablet by mouth daily.    flaxseed oil (OMEGA 3 PO) Take  by mouth.    glucos sul 2KCl/msm/chond/C/Mn (GLUCOSAMINE CHONDROITIN PO) Take  by mouth daily.    LUTEIN PO Take 1 tablet by mouth daily.    metoprolol  tartrate 25 mg tablet Take one tablet by mouth twice daily.    milk of magnesia (CONC) 2,400 mg/10 mL oral suspension Take  by mouth at bedtime daily.    psyllium seed (with sugar) (FIBER PO) Take  by mouth.    SAW PALMETTO PO Take  by mouth daily.    tamsulosin  (FLOMAX ) 0.4 mg capsule Take one capsule by mouth daily. Do not crush, chew or open capsules. Take 30 minutes following the same meal each day.       Waddell Auerbach, APRN  Interventional and Adult Congenital Cardiology  Pager [phone] - Available on Voalte      Total Time Today was > 30 minutes in the following activities: Preparing to see the patient, Obtaining and/or reviewing separately obtained history, Performing a medically appropriate examination and/or evaluation, Counseling and educating the patient/family/caregiver, and Documenting clinical information in the electronic or other health record

## 2024-01-22 ENCOUNTER — Ambulatory Visit: Admit: 2024-01-22 | Discharge: 2024-01-22 | Payer: MEDICARE

## 2024-01-22 ENCOUNTER — Ambulatory Visit: Admit: 2024-01-22 | Discharge: 2024-01-23 | Payer: MEDICARE

## 2024-01-22 ENCOUNTER — Encounter: Admit: 2024-01-22 | Discharge: 2024-01-22 | Payer: MEDICARE

## 2024-01-22 DIAGNOSIS — I10 Essential (primary) hypertension: Secondary | ICD-10-CM

## 2024-01-22 DIAGNOSIS — Z95818 Presence of other cardiac implants and grafts: Secondary | ICD-10-CM

## 2024-01-22 DIAGNOSIS — I480 Paroxysmal atrial fibrillation: Secondary | ICD-10-CM

## 2024-01-22 DIAGNOSIS — R0989 Other specified symptoms and signs involving the circulatory and respiratory systems: Secondary | ICD-10-CM

## 2024-01-22 DIAGNOSIS — Z950 Presence of cardiac pacemaker: Secondary | ICD-10-CM

## 2024-01-22 MED ORDER — PROPOFOL 10 MG/ML IV EMUL 20 ML (INFUSION)(AM)(OR)
INTRAVENOUS | 0 refills | Status: DC
Start: 2024-01-22 — End: 2024-01-22
  Administered 2024-01-22: 19:00:00 75 ug/kg/min via INTRAVENOUS

## 2024-01-22 MED ORDER — LIDOCAINE (PF) 20 MG/ML (2 %) IJ SOLN
INTRAVENOUS | 0 refills | Status: DC
Start: 2024-01-22 — End: 2024-01-22

## 2024-01-22 MED ORDER — SODIUM CHLORIDE 0.9 % IV SOLP (OR) 500ML
INTRAVENOUS | 0 refills | Status: DC
Start: 2024-01-22 — End: 2024-01-22

## 2024-01-22 MED ORDER — PROPOFOL INJ 10 MG/ML IV VIAL
INTRAVENOUS | 0 refills | Status: DC
Start: 2024-01-22 — End: 2024-01-22

## 2024-01-22 NOTE — Anesthesia Post-Procedure Evaluation
 Post-Anesthesia Evaluation    Name: Stanley Campbell      MRN: [id number]     DOB: [birth year]     Age: 83 y.o.     Sex: male   __________________________________________________________________________     Procedure Information       Anesthesia Start Date/Time: 01/22/24 1414    Scheduled providers: Luster Armor, MD; Veria Pina, RN    Procedure: TRANSESOPHAGEAL ECHO    Location: Cardiovascular Medicine: [facility]            Post-Anesthesia Vitals  BP: 149/88 (06/25 1504)  Pulse: 64 (06/25 1504)  Respirations: 16 PER MINUTE (06/25 1504)  SpO2: 97 % (06/25 1504)  O2 Device: None (Room air) (06/25 1504)  Height: 182.9 cm (6' 0.01) (06/25 1446)   Vitals Value Taken Time   BP 149/88 01/22/24 15:04   Temp     Pulse 64 01/22/24 15:04   Respirations 16 PER MINUTE 01/22/24 15:04   SpO2 97 % 01/22/24 15:04   O2 Device None (Room air) 01/22/24 15:04   ABP     ART BP           Post Anesthesia Evaluation Note    Evaluation location: Pre/Post  Patient participation: recovered; patient participated in evaluation  Level of consciousness: alert    Pain score: 0  Pain management: adequate    Hydration: normovolemia  Temperature: 36.0?C - 38.4?C  Airway patency: adequate    Perioperative Events       Post-op nausea and vomiting: no PONV    Postoperative Status  Cardiovascular status: hemodynamically stable  Respiratory status: spontaneous ventilation  Follow-up needed: none        Perioperative Events  There were no known complications for this encounter.

## 2024-01-26 ENCOUNTER — Encounter: Admit: 2024-01-26 | Discharge: 2024-01-26 | Payer: MEDICARE

## 2024-02-17 ENCOUNTER — Encounter: Admit: 2024-02-17 | Discharge: 2024-02-17 | Payer: MEDICARE

## 2024-02-17 NOTE — Telephone Encounter
 Called patient and spoke with wife [name].  She found the monitor was not working and we found the cell stick in right side of monitor was not working.  That cell stick keeps causing the monitor to stop working.  Pt would like monitor replaced.  I have ordered them a new [id number] monitor today.  I will call her on 02-27-24 and go over set up. Since they have the monitor hooked to a modum I have asked her to not unplug old monitor until they get new one so we know how to hook it back up.  She is very good at this!  SH

## 2024-03-05 ENCOUNTER — Encounter: Admit: 2024-03-05 | Discharge: 2024-03-05 | Payer: MEDICARE

## 2024-03-05 DIAGNOSIS — I480 Paroxysmal atrial fibrillation: Principal | ICD-10-CM

## 2024-03-06 ENCOUNTER — Encounter: Admit: 2024-03-06 | Discharge: 2024-03-06 | Payer: MEDICARE

## 2024-03-06 ENCOUNTER — Encounter: Admit: 2024-03-06 | Discharge: 2024-03-05 | Payer: MEDICARE

## 2024-03-21 ENCOUNTER — Ambulatory Visit: Admit: 2024-03-21 | Discharge: 2024-03-21 | Payer: MEDICARE

## 2024-04-04 ENCOUNTER — Encounter: Admit: 2024-04-04 | Discharge: 2024-04-04 | Payer: MEDICARE

## 2024-04-27 ENCOUNTER — Encounter: Admit: 2024-04-27 | Discharge: 2024-04-27 | Payer: MEDICARE

## 2024-06-02 ENCOUNTER — Encounter: Admit: 2024-06-02 | Discharge: 2024-06-02 | Payer: MEDICARE

## 2024-06-15 ENCOUNTER — Ambulatory Visit: Admit: 2024-06-15 | Discharge: 2024-06-15 | Payer: MEDICARE

## 2024-06-15 ENCOUNTER — Encounter: Admit: 2024-06-15 | Discharge: 2024-06-15 | Payer: MEDICARE

## 2024-06-15 DIAGNOSIS — Z950 Presence of cardiac pacemaker: Principal | ICD-10-CM

## 2024-06-29 ENCOUNTER — Encounter: Admit: 2024-06-29 | Discharge: 2024-06-29 | Payer: MEDICARE
